# Patient Record
Sex: Female | Born: 1950
Health system: Southern US, Community
[De-identification: ages and names within clinical notes are randomized; demographics above are authoritative.]

## PROBLEM LIST (undated history)

## (undated) DIAGNOSIS — N289 Disorder of kidney and ureter, unspecified: Secondary | ICD-10-CM

## (undated) DIAGNOSIS — Z85528 Personal history of other malignant neoplasm of kidney: Secondary | ICD-10-CM

## (undated) DIAGNOSIS — G4730 Sleep apnea, unspecified: Secondary | ICD-10-CM

## (undated) DIAGNOSIS — F32A Depression, unspecified: Secondary | ICD-10-CM

## (undated) DIAGNOSIS — E559 Vitamin D deficiency, unspecified: Secondary | ICD-10-CM

## (undated) DIAGNOSIS — C801 Malignant (primary) neoplasm, unspecified: Secondary | ICD-10-CM

## (undated) DIAGNOSIS — E538 Deficiency of other specified B group vitamins: Secondary | ICD-10-CM

## (undated) DIAGNOSIS — I10 Essential (primary) hypertension: Secondary | ICD-10-CM

## (undated) DIAGNOSIS — M1990 Unspecified osteoarthritis, unspecified site: Secondary | ICD-10-CM

## (undated) HISTORY — DX: Unspecified osteoarthritis, unspecified site: M19.90

## (undated) HISTORY — DX: Sleep apnea, unspecified: G47.30

## (undated) HISTORY — DX: Depression, unspecified: F32.A

## (undated) HISTORY — DX: Vitamin D deficiency, unspecified: E55.9

## (undated) HISTORY — DX: Deficiency of other specified B group vitamins: E53.8

## (undated) HISTORY — PX: NEPHRECTOMY: SHX65

---

## 2004-09-04 ENCOUNTER — Emergency Department (HOSPITAL_COMMUNITY): Admission: EM | Admit: 2004-09-04 | Discharge: 2004-09-04 | Payer: Self-pay | Admitting: Emergency Medicine

## 2006-09-18 ENCOUNTER — Emergency Department (HOSPITAL_COMMUNITY): Admission: EM | Admit: 2006-09-18 | Discharge: 2006-09-18 | Payer: Self-pay | Admitting: Emergency Medicine

## 2010-12-24 ENCOUNTER — Ambulatory Visit
Admission: RE | Admit: 2010-12-24 | Discharge: 2010-12-24 | Disposition: A | Discharge status: Home / Self Care | Payer: Self-pay | Source: Ambulatory Visit | Attending: Orthopedic Surgery | Admitting: Orthopedic Surgery

## 2010-12-24 ENCOUNTER — Other Ambulatory Visit: Payer: Self-pay | Admitting: Orthopedic Surgery

## 2010-12-24 DIAGNOSIS — R52 Pain, unspecified: Secondary | ICD-10-CM

## 2012-02-07 DIAGNOSIS — G4733 Obstructive sleep apnea (adult) (pediatric): Secondary | ICD-10-CM | POA: Insufficient documentation

## 2012-09-18 DIAGNOSIS — C649 Malignant neoplasm of unspecified kidney, except renal pelvis: Secondary | ICD-10-CM | POA: Insufficient documentation

## 2012-09-18 DIAGNOSIS — F32A Depression, unspecified: Secondary | ICD-10-CM | POA: Insufficient documentation

## 2013-02-21 ENCOUNTER — Other Ambulatory Visit: Payer: Self-pay | Admitting: Orthopedic Surgery

## 2013-02-21 DIAGNOSIS — M549 Dorsalgia, unspecified: Secondary | ICD-10-CM

## 2013-03-03 ENCOUNTER — Ambulatory Visit
Admission: RE | Admit: 2013-03-03 | Discharge: 2013-03-03 | Disposition: A | Discharge status: Home / Self Care | Payer: BC Managed Care – PPO | Source: Ambulatory Visit | Attending: Orthopedic Surgery | Admitting: Orthopedic Surgery

## 2013-03-03 DIAGNOSIS — M549 Dorsalgia, unspecified: Secondary | ICD-10-CM

## 2014-11-29 ENCOUNTER — Other Ambulatory Visit: Payer: Self-pay | Admitting: Orthopedic Surgery

## 2014-11-29 DIAGNOSIS — M79662 Pain in left lower leg: Secondary | ICD-10-CM

## 2014-12-09 ENCOUNTER — Ambulatory Visit
Admission: RE | Admit: 2014-12-09 | Discharge: 2014-12-09 | Disposition: A | Discharge status: Home / Self Care | Payer: BLUE CROSS/BLUE SHIELD | Source: Ambulatory Visit | Attending: Orthopedic Surgery | Admitting: Orthopedic Surgery

## 2014-12-09 DIAGNOSIS — M79662 Pain in left lower leg: Secondary | ICD-10-CM

## 2016-04-29 DIAGNOSIS — R7989 Other specified abnormal findings of blood chemistry: Secondary | ICD-10-CM | POA: Insufficient documentation

## 2016-06-21 ENCOUNTER — Telehealth (INDEPENDENT_AMBULATORY_CARE_PROVIDER_SITE_OTHER): Payer: Self-pay | Admitting: Orthopedic Surgery

## 2016-06-21 NOTE — Telephone Encounter (Signed)
Patient's husband (douglas) called advised his wife need new Orthotics. He asked if Dr Marlou Sa can write a Rx for her to get them or does she need to make an appointment to see Dr Marlou Sa. The number to contact patient is [phone]

## 2016-06-22 NOTE — Telephone Encounter (Signed)
Patient last seen 12/2014.  Please advise.

## 2016-06-30 NOTE — Telephone Encounter (Signed)
Ok for orthotics pls call thx

## 2016-06-30 NOTE — Telephone Encounter (Signed)
Can you write Rx for orthotics with Dx?  You can stamp it, thanks

## 2016-06-30 NOTE — Telephone Encounter (Signed)
Called and left message advising could be picked up at front desk

## 2017-04-20 DIAGNOSIS — S20219A Contusion of unspecified front wall of thorax, initial encounter: Secondary | ICD-10-CM | POA: Diagnosis not present

## 2017-05-11 DIAGNOSIS — I951 Orthostatic hypotension: Secondary | ICD-10-CM | POA: Diagnosis not present

## 2017-05-11 DIAGNOSIS — Z6836 Body mass index (BMI) 36.0-36.9, adult: Secondary | ICD-10-CM | POA: Diagnosis not present

## 2017-05-11 DIAGNOSIS — R011 Cardiac murmur, unspecified: Secondary | ICD-10-CM | POA: Diagnosis not present

## 2017-05-11 DIAGNOSIS — R0789 Other chest pain: Secondary | ICD-10-CM | POA: Diagnosis not present

## 2017-05-11 DIAGNOSIS — R42 Dizziness and giddiness: Secondary | ICD-10-CM | POA: Diagnosis not present

## 2017-05-11 DIAGNOSIS — I10 Essential (primary) hypertension: Secondary | ICD-10-CM | POA: Diagnosis not present

## 2017-05-11 DIAGNOSIS — E669 Obesity, unspecified: Secondary | ICD-10-CM | POA: Diagnosis not present

## 2017-05-31 DIAGNOSIS — I10 Essential (primary) hypertension: Secondary | ICD-10-CM | POA: Diagnosis not present

## 2017-05-31 DIAGNOSIS — R0609 Other forms of dyspnea: Secondary | ICD-10-CM | POA: Diagnosis not present

## 2017-06-09 DIAGNOSIS — Z905 Acquired absence of kidney: Secondary | ICD-10-CM | POA: Diagnosis not present

## 2017-06-09 DIAGNOSIS — N1831 Chronic kidney disease, stage 3a: Secondary | ICD-10-CM | POA: Insufficient documentation

## 2017-06-09 DIAGNOSIS — I151 Hypertension secondary to other renal disorders: Secondary | ICD-10-CM | POA: Diagnosis not present

## 2017-06-09 DIAGNOSIS — I10 Essential (primary) hypertension: Secondary | ICD-10-CM | POA: Diagnosis not present

## 2017-06-09 DIAGNOSIS — N2889 Other specified disorders of kidney and ureter: Secondary | ICD-10-CM | POA: Diagnosis not present

## 2017-06-09 DIAGNOSIS — N183 Chronic kidney disease, stage 3 (moderate): Secondary | ICD-10-CM | POA: Diagnosis not present

## 2017-06-24 DIAGNOSIS — Z1231 Encounter for screening mammogram for malignant neoplasm of breast: Secondary | ICD-10-CM | POA: Diagnosis not present

## 2017-06-24 DIAGNOSIS — I517 Cardiomegaly: Secondary | ICD-10-CM | POA: Diagnosis not present

## 2017-06-24 DIAGNOSIS — I080 Rheumatic disorders of both mitral and aortic valves: Secondary | ICD-10-CM | POA: Diagnosis not present

## 2017-07-04 DIAGNOSIS — G4733 Obstructive sleep apnea (adult) (pediatric): Secondary | ICD-10-CM | POA: Diagnosis not present

## 2017-08-10 DIAGNOSIS — H2513 Age-related nuclear cataract, bilateral: Secondary | ICD-10-CM | POA: Diagnosis not present

## 2017-09-13 DIAGNOSIS — M25572 Pain in left ankle and joints of left foot: Secondary | ICD-10-CM | POA: Diagnosis not present

## 2017-09-13 DIAGNOSIS — M79675 Pain in left toe(s): Secondary | ICD-10-CM | POA: Diagnosis not present

## 2017-09-13 DIAGNOSIS — M79671 Pain in right foot: Secondary | ICD-10-CM | POA: Diagnosis not present

## 2017-09-13 DIAGNOSIS — M9906 Segmental and somatic dysfunction of lower extremity: Secondary | ICD-10-CM | POA: Diagnosis not present

## 2017-09-13 DIAGNOSIS — M79674 Pain in right toe(s): Secondary | ICD-10-CM | POA: Diagnosis not present

## 2017-09-13 DIAGNOSIS — M25562 Pain in left knee: Secondary | ICD-10-CM | POA: Diagnosis not present

## 2017-09-13 DIAGNOSIS — M79672 Pain in left foot: Secondary | ICD-10-CM | POA: Diagnosis not present

## 2017-09-13 DIAGNOSIS — M25571 Pain in right ankle and joints of right foot: Secondary | ICD-10-CM | POA: Diagnosis not present

## 2017-09-16 DIAGNOSIS — M9906 Segmental and somatic dysfunction of lower extremity: Secondary | ICD-10-CM | POA: Diagnosis not present

## 2017-09-16 DIAGNOSIS — M79671 Pain in right foot: Secondary | ICD-10-CM | POA: Diagnosis not present

## 2017-09-16 DIAGNOSIS — M79674 Pain in right toe(s): Secondary | ICD-10-CM | POA: Diagnosis not present

## 2017-09-16 DIAGNOSIS — M25572 Pain in left ankle and joints of left foot: Secondary | ICD-10-CM | POA: Diagnosis not present

## 2017-09-16 DIAGNOSIS — M25571 Pain in right ankle and joints of right foot: Secondary | ICD-10-CM | POA: Diagnosis not present

## 2017-09-16 DIAGNOSIS — M25562 Pain in left knee: Secondary | ICD-10-CM | POA: Diagnosis not present

## 2017-09-16 DIAGNOSIS — M79675 Pain in left toe(s): Secondary | ICD-10-CM | POA: Diagnosis not present

## 2017-09-16 DIAGNOSIS — M79672 Pain in left foot: Secondary | ICD-10-CM | POA: Diagnosis not present

## 2017-09-20 DIAGNOSIS — Z85528 Personal history of other malignant neoplasm of kidney: Secondary | ICD-10-CM | POA: Diagnosis not present

## 2017-09-20 DIAGNOSIS — R42 Dizziness and giddiness: Secondary | ICD-10-CM | POA: Diagnosis not present

## 2017-09-20 DIAGNOSIS — I10 Essential (primary) hypertension: Secondary | ICD-10-CM | POA: Diagnosis not present

## 2017-09-20 DIAGNOSIS — H8120 Vestibular neuronitis, unspecified ear: Secondary | ICD-10-CM | POA: Diagnosis not present

## 2017-09-20 DIAGNOSIS — R1110 Vomiting, unspecified: Secondary | ICD-10-CM | POA: Diagnosis not present

## 2017-09-20 DIAGNOSIS — Z79899 Other long term (current) drug therapy: Secondary | ICD-10-CM | POA: Diagnosis not present

## 2017-09-20 DIAGNOSIS — I639 Cerebral infarction, unspecified: Secondary | ICD-10-CM | POA: Diagnosis not present

## 2017-09-27 DIAGNOSIS — M25571 Pain in right ankle and joints of right foot: Secondary | ICD-10-CM | POA: Diagnosis not present

## 2017-09-27 DIAGNOSIS — M79674 Pain in right toe(s): Secondary | ICD-10-CM | POA: Diagnosis not present

## 2017-09-27 DIAGNOSIS — M79672 Pain in left foot: Secondary | ICD-10-CM | POA: Diagnosis not present

## 2017-09-27 DIAGNOSIS — M25572 Pain in left ankle and joints of left foot: Secondary | ICD-10-CM | POA: Diagnosis not present

## 2017-09-27 DIAGNOSIS — M79671 Pain in right foot: Secondary | ICD-10-CM | POA: Diagnosis not present

## 2017-09-27 DIAGNOSIS — M25562 Pain in left knee: Secondary | ICD-10-CM | POA: Diagnosis not present

## 2017-09-27 DIAGNOSIS — M9906 Segmental and somatic dysfunction of lower extremity: Secondary | ICD-10-CM | POA: Diagnosis not present

## 2017-09-27 DIAGNOSIS — M79675 Pain in left toe(s): Secondary | ICD-10-CM | POA: Diagnosis not present

## 2017-09-28 DIAGNOSIS — M79675 Pain in left toe(s): Secondary | ICD-10-CM | POA: Diagnosis not present

## 2017-09-28 DIAGNOSIS — M79674 Pain in right toe(s): Secondary | ICD-10-CM | POA: Diagnosis not present

## 2017-09-28 DIAGNOSIS — R42 Dizziness and giddiness: Secondary | ICD-10-CM | POA: Diagnosis not present

## 2017-09-28 DIAGNOSIS — M25571 Pain in right ankle and joints of right foot: Secondary | ICD-10-CM | POA: Diagnosis not present

## 2017-09-28 DIAGNOSIS — M9906 Segmental and somatic dysfunction of lower extremity: Secondary | ICD-10-CM | POA: Diagnosis not present

## 2017-09-28 DIAGNOSIS — M25562 Pain in left knee: Secondary | ICD-10-CM | POA: Diagnosis not present

## 2017-09-28 DIAGNOSIS — M79672 Pain in left foot: Secondary | ICD-10-CM | POA: Diagnosis not present

## 2017-09-28 DIAGNOSIS — M25572 Pain in left ankle and joints of left foot: Secondary | ICD-10-CM | POA: Diagnosis not present

## 2017-09-28 DIAGNOSIS — M79671 Pain in right foot: Secondary | ICD-10-CM | POA: Diagnosis not present

## 2017-09-28 DIAGNOSIS — M791 Myalgia, unspecified site: Secondary | ICD-10-CM | POA: Diagnosis not present

## 2017-09-29 DIAGNOSIS — M791 Myalgia, unspecified site: Secondary | ICD-10-CM | POA: Diagnosis not present

## 2017-09-29 DIAGNOSIS — M25562 Pain in left knee: Secondary | ICD-10-CM | POA: Diagnosis not present

## 2017-09-29 DIAGNOSIS — M79671 Pain in right foot: Secondary | ICD-10-CM | POA: Diagnosis not present

## 2017-09-29 DIAGNOSIS — M79675 Pain in left toe(s): Secondary | ICD-10-CM | POA: Diagnosis not present

## 2017-09-29 DIAGNOSIS — M79672 Pain in left foot: Secondary | ICD-10-CM | POA: Diagnosis not present

## 2017-09-29 DIAGNOSIS — M25571 Pain in right ankle and joints of right foot: Secondary | ICD-10-CM | POA: Diagnosis not present

## 2017-09-29 DIAGNOSIS — R42 Dizziness and giddiness: Secondary | ICD-10-CM | POA: Diagnosis not present

## 2017-09-29 DIAGNOSIS — M79674 Pain in right toe(s): Secondary | ICD-10-CM | POA: Diagnosis not present

## 2017-09-29 DIAGNOSIS — M25572 Pain in left ankle and joints of left foot: Secondary | ICD-10-CM | POA: Diagnosis not present

## 2017-09-29 DIAGNOSIS — M9906 Segmental and somatic dysfunction of lower extremity: Secondary | ICD-10-CM | POA: Diagnosis not present

## 2017-10-04 DIAGNOSIS — M79671 Pain in right foot: Secondary | ICD-10-CM | POA: Diagnosis not present

## 2017-10-04 DIAGNOSIS — M25562 Pain in left knee: Secondary | ICD-10-CM | POA: Diagnosis not present

## 2017-10-04 DIAGNOSIS — M79675 Pain in left toe(s): Secondary | ICD-10-CM | POA: Diagnosis not present

## 2017-10-04 DIAGNOSIS — M79672 Pain in left foot: Secondary | ICD-10-CM | POA: Diagnosis not present

## 2017-10-04 DIAGNOSIS — M9906 Segmental and somatic dysfunction of lower extremity: Secondary | ICD-10-CM | POA: Diagnosis not present

## 2017-10-04 DIAGNOSIS — M25571 Pain in right ankle and joints of right foot: Secondary | ICD-10-CM | POA: Diagnosis not present

## 2017-10-04 DIAGNOSIS — M791 Myalgia, unspecified site: Secondary | ICD-10-CM | POA: Diagnosis not present

## 2017-10-04 DIAGNOSIS — M25572 Pain in left ankle and joints of left foot: Secondary | ICD-10-CM | POA: Diagnosis not present

## 2017-10-04 DIAGNOSIS — R42 Dizziness and giddiness: Secondary | ICD-10-CM | POA: Diagnosis not present

## 2017-10-04 DIAGNOSIS — M79674 Pain in right toe(s): Secondary | ICD-10-CM | POA: Diagnosis not present

## 2017-10-05 DIAGNOSIS — H9311 Tinnitus, right ear: Secondary | ICD-10-CM | POA: Diagnosis not present

## 2017-10-05 DIAGNOSIS — H6121 Impacted cerumen, right ear: Secondary | ICD-10-CM | POA: Diagnosis not present

## 2017-10-05 DIAGNOSIS — H6061 Unspecified chronic otitis externa, right ear: Secondary | ICD-10-CM | POA: Diagnosis not present

## 2017-10-05 DIAGNOSIS — H903 Sensorineural hearing loss, bilateral: Secondary | ICD-10-CM | POA: Diagnosis not present

## 2017-10-05 DIAGNOSIS — J320 Chronic maxillary sinusitis: Secondary | ICD-10-CM | POA: Diagnosis not present

## 2017-10-05 DIAGNOSIS — H8143 Vertigo of central origin, bilateral: Secondary | ICD-10-CM | POA: Diagnosis not present

## 2017-10-06 DIAGNOSIS — M25571 Pain in right ankle and joints of right foot: Secondary | ICD-10-CM | POA: Diagnosis not present

## 2017-10-06 DIAGNOSIS — M25572 Pain in left ankle and joints of left foot: Secondary | ICD-10-CM | POA: Diagnosis not present

## 2017-10-06 DIAGNOSIS — M79672 Pain in left foot: Secondary | ICD-10-CM | POA: Diagnosis not present

## 2017-10-06 DIAGNOSIS — M79675 Pain in left toe(s): Secondary | ICD-10-CM | POA: Diagnosis not present

## 2017-10-06 DIAGNOSIS — M25562 Pain in left knee: Secondary | ICD-10-CM | POA: Diagnosis not present

## 2017-10-06 DIAGNOSIS — R42 Dizziness and giddiness: Secondary | ICD-10-CM | POA: Diagnosis not present

## 2017-10-06 DIAGNOSIS — M79674 Pain in right toe(s): Secondary | ICD-10-CM | POA: Diagnosis not present

## 2017-10-06 DIAGNOSIS — M791 Myalgia, unspecified site: Secondary | ICD-10-CM | POA: Diagnosis not present

## 2017-10-06 DIAGNOSIS — M79671 Pain in right foot: Secondary | ICD-10-CM | POA: Diagnosis not present

## 2017-10-06 DIAGNOSIS — M9906 Segmental and somatic dysfunction of lower extremity: Secondary | ICD-10-CM | POA: Diagnosis not present

## 2017-10-11 DIAGNOSIS — M79671 Pain in right foot: Secondary | ICD-10-CM | POA: Diagnosis not present

## 2017-10-11 DIAGNOSIS — M25562 Pain in left knee: Secondary | ICD-10-CM | POA: Diagnosis not present

## 2017-10-11 DIAGNOSIS — M79674 Pain in right toe(s): Secondary | ICD-10-CM | POA: Diagnosis not present

## 2017-10-11 DIAGNOSIS — M79675 Pain in left toe(s): Secondary | ICD-10-CM | POA: Diagnosis not present

## 2017-10-11 DIAGNOSIS — R42 Dizziness and giddiness: Secondary | ICD-10-CM | POA: Diagnosis not present

## 2017-10-11 DIAGNOSIS — M25571 Pain in right ankle and joints of right foot: Secondary | ICD-10-CM | POA: Diagnosis not present

## 2017-10-11 DIAGNOSIS — M791 Myalgia, unspecified site: Secondary | ICD-10-CM | POA: Diagnosis not present

## 2017-10-11 DIAGNOSIS — M9906 Segmental and somatic dysfunction of lower extremity: Secondary | ICD-10-CM | POA: Diagnosis not present

## 2017-10-11 DIAGNOSIS — M79672 Pain in left foot: Secondary | ICD-10-CM | POA: Diagnosis not present

## 2017-10-11 DIAGNOSIS — M25572 Pain in left ankle and joints of left foot: Secondary | ICD-10-CM | POA: Diagnosis not present

## 2017-10-17 DIAGNOSIS — R69 Illness, unspecified: Secondary | ICD-10-CM | POA: Diagnosis not present

## 2017-10-17 DIAGNOSIS — K05323 Chronic periodontitis, generalized, severe: Secondary | ICD-10-CM | POA: Diagnosis not present

## 2017-10-25 DIAGNOSIS — M25571 Pain in right ankle and joints of right foot: Secondary | ICD-10-CM | POA: Diagnosis not present

## 2017-10-25 DIAGNOSIS — M9906 Segmental and somatic dysfunction of lower extremity: Secondary | ICD-10-CM | POA: Diagnosis not present

## 2017-10-25 DIAGNOSIS — M791 Myalgia, unspecified site: Secondary | ICD-10-CM | POA: Diagnosis not present

## 2017-10-25 DIAGNOSIS — M79674 Pain in right toe(s): Secondary | ICD-10-CM | POA: Diagnosis not present

## 2017-10-25 DIAGNOSIS — R42 Dizziness and giddiness: Secondary | ICD-10-CM | POA: Diagnosis not present

## 2017-10-25 DIAGNOSIS — M79675 Pain in left toe(s): Secondary | ICD-10-CM | POA: Diagnosis not present

## 2017-10-25 DIAGNOSIS — M25572 Pain in left ankle and joints of left foot: Secondary | ICD-10-CM | POA: Diagnosis not present

## 2017-10-25 DIAGNOSIS — M25562 Pain in left knee: Secondary | ICD-10-CM | POA: Diagnosis not present

## 2017-10-25 DIAGNOSIS — M79672 Pain in left foot: Secondary | ICD-10-CM | POA: Diagnosis not present

## 2017-10-25 DIAGNOSIS — M79671 Pain in right foot: Secondary | ICD-10-CM | POA: Diagnosis not present

## 2017-10-26 DIAGNOSIS — H8143 Vertigo of central origin, bilateral: Secondary | ICD-10-CM | POA: Diagnosis not present

## 2017-10-26 DIAGNOSIS — H8110 Benign paroxysmal vertigo, unspecified ear: Secondary | ICD-10-CM | POA: Diagnosis not present

## 2017-10-27 DIAGNOSIS — M9906 Segmental and somatic dysfunction of lower extremity: Secondary | ICD-10-CM | POA: Diagnosis not present

## 2017-10-27 DIAGNOSIS — M25562 Pain in left knee: Secondary | ICD-10-CM | POA: Diagnosis not present

## 2017-10-27 DIAGNOSIS — M79672 Pain in left foot: Secondary | ICD-10-CM | POA: Diagnosis not present

## 2017-10-27 DIAGNOSIS — M791 Myalgia, unspecified site: Secondary | ICD-10-CM | POA: Diagnosis not present

## 2017-10-27 DIAGNOSIS — R42 Dizziness and giddiness: Secondary | ICD-10-CM | POA: Diagnosis not present

## 2017-10-27 DIAGNOSIS — M79671 Pain in right foot: Secondary | ICD-10-CM | POA: Diagnosis not present

## 2017-10-27 DIAGNOSIS — M25572 Pain in left ankle and joints of left foot: Secondary | ICD-10-CM | POA: Diagnosis not present

## 2017-10-27 DIAGNOSIS — M79674 Pain in right toe(s): Secondary | ICD-10-CM | POA: Diagnosis not present

## 2017-10-27 DIAGNOSIS — M25571 Pain in right ankle and joints of right foot: Secondary | ICD-10-CM | POA: Diagnosis not present

## 2017-10-27 DIAGNOSIS — M79675 Pain in left toe(s): Secondary | ICD-10-CM | POA: Diagnosis not present

## 2017-11-02 DIAGNOSIS — M79674 Pain in right toe(s): Secondary | ICD-10-CM | POA: Diagnosis not present

## 2017-11-02 DIAGNOSIS — M79671 Pain in right foot: Secondary | ICD-10-CM | POA: Diagnosis not present

## 2017-11-02 DIAGNOSIS — M25571 Pain in right ankle and joints of right foot: Secondary | ICD-10-CM | POA: Diagnosis not present

## 2017-11-02 DIAGNOSIS — M79675 Pain in left toe(s): Secondary | ICD-10-CM | POA: Diagnosis not present

## 2017-11-02 DIAGNOSIS — R42 Dizziness and giddiness: Secondary | ICD-10-CM | POA: Diagnosis not present

## 2017-11-02 DIAGNOSIS — M25562 Pain in left knee: Secondary | ICD-10-CM | POA: Diagnosis not present

## 2017-11-02 DIAGNOSIS — M79672 Pain in left foot: Secondary | ICD-10-CM | POA: Diagnosis not present

## 2017-11-02 DIAGNOSIS — M791 Myalgia, unspecified site: Secondary | ICD-10-CM | POA: Diagnosis not present

## 2017-11-02 DIAGNOSIS — M9906 Segmental and somatic dysfunction of lower extremity: Secondary | ICD-10-CM | POA: Diagnosis not present

## 2017-11-02 DIAGNOSIS — M25572 Pain in left ankle and joints of left foot: Secondary | ICD-10-CM | POA: Diagnosis not present

## 2017-11-04 DIAGNOSIS — M79674 Pain in right toe(s): Secondary | ICD-10-CM | POA: Diagnosis not present

## 2017-11-04 DIAGNOSIS — M79671 Pain in right foot: Secondary | ICD-10-CM | POA: Diagnosis not present

## 2017-11-04 DIAGNOSIS — M9906 Segmental and somatic dysfunction of lower extremity: Secondary | ICD-10-CM | POA: Diagnosis not present

## 2017-11-04 DIAGNOSIS — M25572 Pain in left ankle and joints of left foot: Secondary | ICD-10-CM | POA: Diagnosis not present

## 2017-11-04 DIAGNOSIS — M25562 Pain in left knee: Secondary | ICD-10-CM | POA: Diagnosis not present

## 2017-11-04 DIAGNOSIS — R42 Dizziness and giddiness: Secondary | ICD-10-CM | POA: Diagnosis not present

## 2017-11-04 DIAGNOSIS — M791 Myalgia, unspecified site: Secondary | ICD-10-CM | POA: Diagnosis not present

## 2017-11-04 DIAGNOSIS — M79672 Pain in left foot: Secondary | ICD-10-CM | POA: Diagnosis not present

## 2017-11-04 DIAGNOSIS — M25571 Pain in right ankle and joints of right foot: Secondary | ICD-10-CM | POA: Diagnosis not present

## 2017-11-04 DIAGNOSIS — M79675 Pain in left toe(s): Secondary | ICD-10-CM | POA: Diagnosis not present

## 2017-11-09 DIAGNOSIS — M79675 Pain in left toe(s): Secondary | ICD-10-CM | POA: Diagnosis not present

## 2017-11-09 DIAGNOSIS — R42 Dizziness and giddiness: Secondary | ICD-10-CM | POA: Diagnosis not present

## 2017-11-09 DIAGNOSIS — M25572 Pain in left ankle and joints of left foot: Secondary | ICD-10-CM | POA: Diagnosis not present

## 2017-11-09 DIAGNOSIS — M25562 Pain in left knee: Secondary | ICD-10-CM | POA: Diagnosis not present

## 2017-11-09 DIAGNOSIS — M791 Myalgia, unspecified site: Secondary | ICD-10-CM | POA: Diagnosis not present

## 2017-11-09 DIAGNOSIS — M9906 Segmental and somatic dysfunction of lower extremity: Secondary | ICD-10-CM | POA: Diagnosis not present

## 2017-11-09 DIAGNOSIS — M25571 Pain in right ankle and joints of right foot: Secondary | ICD-10-CM | POA: Diagnosis not present

## 2017-11-09 DIAGNOSIS — M79674 Pain in right toe(s): Secondary | ICD-10-CM | POA: Diagnosis not present

## 2017-11-09 DIAGNOSIS — M79672 Pain in left foot: Secondary | ICD-10-CM | POA: Diagnosis not present

## 2017-11-09 DIAGNOSIS — M79671 Pain in right foot: Secondary | ICD-10-CM | POA: Diagnosis not present

## 2017-11-10 DIAGNOSIS — I10 Essential (primary) hypertension: Secondary | ICD-10-CM | POA: Diagnosis not present

## 2017-11-10 DIAGNOSIS — E039 Hypothyroidism, unspecified: Secondary | ICD-10-CM | POA: Diagnosis not present

## 2017-11-10 DIAGNOSIS — J0101 Acute recurrent maxillary sinusitis: Secondary | ICD-10-CM | POA: Diagnosis not present

## 2017-11-11 DIAGNOSIS — H6521 Chronic serous otitis media, right ear: Secondary | ICD-10-CM | POA: Diagnosis not present

## 2017-11-14 DIAGNOSIS — R42 Dizziness and giddiness: Secondary | ICD-10-CM | POA: Diagnosis not present

## 2017-11-14 DIAGNOSIS — Z905 Acquired absence of kidney: Secondary | ICD-10-CM | POA: Insufficient documentation

## 2017-11-14 DIAGNOSIS — N2889 Other specified disorders of kidney and ureter: Secondary | ICD-10-CM | POA: Diagnosis not present

## 2017-11-14 DIAGNOSIS — M79674 Pain in right toe(s): Secondary | ICD-10-CM | POA: Diagnosis not present

## 2017-11-14 DIAGNOSIS — M9906 Segmental and somatic dysfunction of lower extremity: Secondary | ICD-10-CM | POA: Diagnosis not present

## 2017-11-14 DIAGNOSIS — M79672 Pain in left foot: Secondary | ICD-10-CM | POA: Diagnosis not present

## 2017-11-14 DIAGNOSIS — I10 Essential (primary) hypertension: Secondary | ICD-10-CM | POA: Diagnosis not present

## 2017-11-14 DIAGNOSIS — M79675 Pain in left toe(s): Secondary | ICD-10-CM | POA: Diagnosis not present

## 2017-11-14 DIAGNOSIS — I151 Hypertension secondary to other renal disorders: Secondary | ICD-10-CM | POA: Diagnosis not present

## 2017-11-14 DIAGNOSIS — M25571 Pain in right ankle and joints of right foot: Secondary | ICD-10-CM | POA: Diagnosis not present

## 2017-11-14 DIAGNOSIS — M25572 Pain in left ankle and joints of left foot: Secondary | ICD-10-CM | POA: Diagnosis not present

## 2017-11-14 DIAGNOSIS — M79671 Pain in right foot: Secondary | ICD-10-CM | POA: Diagnosis not present

## 2017-11-14 DIAGNOSIS — M791 Myalgia, unspecified site: Secondary | ICD-10-CM | POA: Diagnosis not present

## 2017-11-14 DIAGNOSIS — N183 Chronic kidney disease, stage 3 (moderate): Secondary | ICD-10-CM | POA: Diagnosis not present

## 2017-11-14 DIAGNOSIS — M25562 Pain in left knee: Secondary | ICD-10-CM | POA: Diagnosis not present

## 2017-11-14 DIAGNOSIS — C642 Malignant neoplasm of left kidney, except renal pelvis: Secondary | ICD-10-CM | POA: Diagnosis not present

## 2017-11-24 DIAGNOSIS — G4733 Obstructive sleep apnea (adult) (pediatric): Secondary | ICD-10-CM | POA: Diagnosis not present

## 2017-11-29 DIAGNOSIS — M79675 Pain in left toe(s): Secondary | ICD-10-CM | POA: Diagnosis not present

## 2017-11-29 DIAGNOSIS — M9906 Segmental and somatic dysfunction of lower extremity: Secondary | ICD-10-CM | POA: Diagnosis not present

## 2017-11-29 DIAGNOSIS — M79672 Pain in left foot: Secondary | ICD-10-CM | POA: Diagnosis not present

## 2017-11-29 DIAGNOSIS — R42 Dizziness and giddiness: Secondary | ICD-10-CM | POA: Diagnosis not present

## 2017-11-29 DIAGNOSIS — M25572 Pain in left ankle and joints of left foot: Secondary | ICD-10-CM | POA: Diagnosis not present

## 2017-11-29 DIAGNOSIS — M25571 Pain in right ankle and joints of right foot: Secondary | ICD-10-CM | POA: Diagnosis not present

## 2017-11-29 DIAGNOSIS — M79674 Pain in right toe(s): Secondary | ICD-10-CM | POA: Diagnosis not present

## 2017-11-29 DIAGNOSIS — M791 Myalgia, unspecified site: Secondary | ICD-10-CM | POA: Diagnosis not present

## 2017-11-29 DIAGNOSIS — M79671 Pain in right foot: Secondary | ICD-10-CM | POA: Diagnosis not present

## 2017-11-29 DIAGNOSIS — M25562 Pain in left knee: Secondary | ICD-10-CM | POA: Diagnosis not present

## 2017-12-01 DIAGNOSIS — M9902 Segmental and somatic dysfunction of thoracic region: Secondary | ICD-10-CM | POA: Diagnosis not present

## 2017-12-01 DIAGNOSIS — M47814 Spondylosis without myelopathy or radiculopathy, thoracic region: Secondary | ICD-10-CM | POA: Diagnosis not present

## 2017-12-01 DIAGNOSIS — M5032 Other cervical disc degeneration, mid-cervical region, unspecified level: Secondary | ICD-10-CM | POA: Diagnosis not present

## 2017-12-01 DIAGNOSIS — M9901 Segmental and somatic dysfunction of cervical region: Secondary | ICD-10-CM | POA: Diagnosis not present

## 2017-12-06 DIAGNOSIS — M9902 Segmental and somatic dysfunction of thoracic region: Secondary | ICD-10-CM | POA: Diagnosis not present

## 2017-12-06 DIAGNOSIS — M5032 Other cervical disc degeneration, mid-cervical region, unspecified level: Secondary | ICD-10-CM | POA: Diagnosis not present

## 2017-12-06 DIAGNOSIS — M47814 Spondylosis without myelopathy or radiculopathy, thoracic region: Secondary | ICD-10-CM | POA: Diagnosis not present

## 2017-12-06 DIAGNOSIS — M9901 Segmental and somatic dysfunction of cervical region: Secondary | ICD-10-CM | POA: Diagnosis not present

## 2017-12-07 DIAGNOSIS — R42 Dizziness and giddiness: Secondary | ICD-10-CM | POA: Diagnosis not present

## 2017-12-14 DIAGNOSIS — M9901 Segmental and somatic dysfunction of cervical region: Secondary | ICD-10-CM | POA: Diagnosis not present

## 2017-12-14 DIAGNOSIS — M47814 Spondylosis without myelopathy or radiculopathy, thoracic region: Secondary | ICD-10-CM | POA: Diagnosis not present

## 2017-12-14 DIAGNOSIS — M5032 Other cervical disc degeneration, mid-cervical region, unspecified level: Secondary | ICD-10-CM | POA: Diagnosis not present

## 2017-12-14 DIAGNOSIS — M9902 Segmental and somatic dysfunction of thoracic region: Secondary | ICD-10-CM | POA: Diagnosis not present

## 2017-12-26 DIAGNOSIS — G4733 Obstructive sleep apnea (adult) (pediatric): Secondary | ICD-10-CM | POA: Diagnosis not present

## 2017-12-28 DIAGNOSIS — M9902 Segmental and somatic dysfunction of thoracic region: Secondary | ICD-10-CM | POA: Diagnosis not present

## 2017-12-28 DIAGNOSIS — M9901 Segmental and somatic dysfunction of cervical region: Secondary | ICD-10-CM | POA: Diagnosis not present

## 2017-12-28 DIAGNOSIS — M47814 Spondylosis without myelopathy or radiculopathy, thoracic region: Secondary | ICD-10-CM | POA: Diagnosis not present

## 2017-12-28 DIAGNOSIS — M5032 Other cervical disc degeneration, mid-cervical region, unspecified level: Secondary | ICD-10-CM | POA: Diagnosis not present

## 2017-12-29 DIAGNOSIS — M47814 Spondylosis without myelopathy or radiculopathy, thoracic region: Secondary | ICD-10-CM | POA: Diagnosis not present

## 2017-12-29 DIAGNOSIS — M5032 Other cervical disc degeneration, mid-cervical region, unspecified level: Secondary | ICD-10-CM | POA: Diagnosis not present

## 2017-12-29 DIAGNOSIS — M9902 Segmental and somatic dysfunction of thoracic region: Secondary | ICD-10-CM | POA: Diagnosis not present

## 2017-12-29 DIAGNOSIS — M9901 Segmental and somatic dysfunction of cervical region: Secondary | ICD-10-CM | POA: Diagnosis not present

## 2018-01-26 DIAGNOSIS — G4733 Obstructive sleep apnea (adult) (pediatric): Secondary | ICD-10-CM | POA: Diagnosis not present

## 2018-03-21 DIAGNOSIS — H353121 Nonexudative age-related macular degeneration, left eye, early dry stage: Secondary | ICD-10-CM | POA: Diagnosis not present

## 2018-03-21 DIAGNOSIS — H353114 Nonexudative age-related macular degeneration, right eye, advanced atrophic with subfoveal involvement: Secondary | ICD-10-CM | POA: Diagnosis not present

## 2018-03-21 DIAGNOSIS — H43811 Vitreous degeneration, right eye: Secondary | ICD-10-CM | POA: Diagnosis not present

## 2018-03-21 DIAGNOSIS — H43822 Vitreomacular adhesion, left eye: Secondary | ICD-10-CM | POA: Diagnosis not present

## 2018-04-10 DIAGNOSIS — I10 Essential (primary) hypertension: Secondary | ICD-10-CM | POA: Diagnosis not present

## 2018-04-10 DIAGNOSIS — Z0000 Encounter for general adult medical examination without abnormal findings: Secondary | ICD-10-CM | POA: Diagnosis not present

## 2018-04-10 DIAGNOSIS — R5383 Other fatigue: Secondary | ICD-10-CM | POA: Diagnosis not present

## 2018-04-10 DIAGNOSIS — E039 Hypothyroidism, unspecified: Secondary | ICD-10-CM | POA: Diagnosis not present

## 2018-04-10 DIAGNOSIS — R69 Illness, unspecified: Secondary | ICD-10-CM | POA: Diagnosis not present

## 2018-04-10 DIAGNOSIS — Z780 Asymptomatic menopausal state: Secondary | ICD-10-CM | POA: Diagnosis not present

## 2018-04-10 DIAGNOSIS — F329 Major depressive disorder, single episode, unspecified: Secondary | ICD-10-CM | POA: Diagnosis not present

## 2018-04-10 DIAGNOSIS — R011 Cardiac murmur, unspecified: Secondary | ICD-10-CM | POA: Diagnosis not present

## 2018-06-03 DIAGNOSIS — G4733 Obstructive sleep apnea (adult) (pediatric): Secondary | ICD-10-CM | POA: Diagnosis not present

## 2018-06-21 DIAGNOSIS — N183 Chronic kidney disease, stage 3 (moderate): Secondary | ICD-10-CM | POA: Diagnosis not present

## 2018-06-21 DIAGNOSIS — I10 Essential (primary) hypertension: Secondary | ICD-10-CM | POA: Diagnosis not present

## 2018-06-21 DIAGNOSIS — Z905 Acquired absence of kidney: Secondary | ICD-10-CM | POA: Diagnosis not present

## 2018-06-21 DIAGNOSIS — C642 Malignant neoplasm of left kidney, except renal pelvis: Secondary | ICD-10-CM | POA: Diagnosis not present

## 2018-07-04 DIAGNOSIS — G4733 Obstructive sleep apnea (adult) (pediatric): Secondary | ICD-10-CM | POA: Diagnosis not present

## 2018-08-04 DIAGNOSIS — G4733 Obstructive sleep apnea (adult) (pediatric): Secondary | ICD-10-CM | POA: Diagnosis not present

## 2018-08-11 DIAGNOSIS — Z85528 Personal history of other malignant neoplasm of kidney: Secondary | ICD-10-CM | POA: Diagnosis not present

## 2018-08-11 DIAGNOSIS — N183 Chronic kidney disease, stage 3 (moderate): Secondary | ICD-10-CM | POA: Diagnosis not present

## 2018-08-11 DIAGNOSIS — E039 Hypothyroidism, unspecified: Secondary | ICD-10-CM | POA: Diagnosis not present

## 2018-09-04 DIAGNOSIS — G4733 Obstructive sleep apnea (adult) (pediatric): Secondary | ICD-10-CM | POA: Diagnosis not present

## 2018-10-05 DIAGNOSIS — G4733 Obstructive sleep apnea (adult) (pediatric): Secondary | ICD-10-CM | POA: Diagnosis not present

## 2018-11-06 DIAGNOSIS — G4733 Obstructive sleep apnea (adult) (pediatric): Secondary | ICD-10-CM | POA: Diagnosis not present

## 2018-12-07 DIAGNOSIS — G4733 Obstructive sleep apnea (adult) (pediatric): Secondary | ICD-10-CM | POA: Diagnosis not present

## 2018-12-25 DIAGNOSIS — I151 Hypertension secondary to other renal disorders: Secondary | ICD-10-CM | POA: Diagnosis not present

## 2018-12-25 DIAGNOSIS — Z905 Acquired absence of kidney: Secondary | ICD-10-CM | POA: Diagnosis not present

## 2018-12-25 DIAGNOSIS — C642 Malignant neoplasm of left kidney, except renal pelvis: Secondary | ICD-10-CM | POA: Diagnosis not present

## 2018-12-25 DIAGNOSIS — N183 Chronic kidney disease, stage 3 (moderate): Secondary | ICD-10-CM | POA: Diagnosis not present

## 2018-12-25 DIAGNOSIS — R739 Hyperglycemia, unspecified: Secondary | ICD-10-CM | POA: Diagnosis not present

## 2018-12-25 DIAGNOSIS — I10 Essential (primary) hypertension: Secondary | ICD-10-CM | POA: Diagnosis not present

## 2019-01-12 DIAGNOSIS — G4733 Obstructive sleep apnea (adult) (pediatric): Secondary | ICD-10-CM | POA: Diagnosis not present

## 2019-01-22 DIAGNOSIS — R69 Illness, unspecified: Secondary | ICD-10-CM | POA: Diagnosis not present

## 2019-02-13 DIAGNOSIS — G4733 Obstructive sleep apnea (adult) (pediatric): Secondary | ICD-10-CM | POA: Diagnosis not present

## 2019-03-03 DIAGNOSIS — Z20828 Contact with and (suspected) exposure to other viral communicable diseases: Secondary | ICD-10-CM | POA: Diagnosis not present

## 2019-03-16 DIAGNOSIS — G4733 Obstructive sleep apnea (adult) (pediatric): Secondary | ICD-10-CM | POA: Diagnosis not present

## 2019-03-27 DIAGNOSIS — H43811 Vitreous degeneration, right eye: Secondary | ICD-10-CM | POA: Diagnosis not present

## 2019-03-27 DIAGNOSIS — H353121 Nonexudative age-related macular degeneration, left eye, early dry stage: Secondary | ICD-10-CM | POA: Diagnosis not present

## 2019-03-27 DIAGNOSIS — H353114 Nonexudative age-related macular degeneration, right eye, advanced atrophic with subfoveal involvement: Secondary | ICD-10-CM | POA: Diagnosis not present

## 2019-03-27 DIAGNOSIS — H2513 Age-related nuclear cataract, bilateral: Secondary | ICD-10-CM | POA: Diagnosis not present

## 2019-03-29 DIAGNOSIS — Z0000 Encounter for general adult medical examination without abnormal findings: Secondary | ICD-10-CM | POA: Diagnosis not present

## 2019-03-29 DIAGNOSIS — H269 Unspecified cataract: Secondary | ICD-10-CM | POA: Diagnosis not present

## 2019-03-29 DIAGNOSIS — H3530 Unspecified macular degeneration: Secondary | ICD-10-CM | POA: Diagnosis not present

## 2019-03-29 DIAGNOSIS — G4700 Insomnia, unspecified: Secondary | ICD-10-CM | POA: Diagnosis not present

## 2019-03-29 DIAGNOSIS — I10 Essential (primary) hypertension: Secondary | ICD-10-CM | POA: Diagnosis not present

## 2019-03-29 DIAGNOSIS — E039 Hypothyroidism, unspecified: Secondary | ICD-10-CM | POA: Diagnosis not present

## 2019-04-16 DIAGNOSIS — G4733 Obstructive sleep apnea (adult) (pediatric): Secondary | ICD-10-CM | POA: Diagnosis not present

## 2019-04-17 DIAGNOSIS — H25043 Posterior subcapsular polar age-related cataract, bilateral: Secondary | ICD-10-CM | POA: Diagnosis not present

## 2019-04-17 DIAGNOSIS — H2513 Age-related nuclear cataract, bilateral: Secondary | ICD-10-CM | POA: Diagnosis not present

## 2019-04-17 DIAGNOSIS — H18413 Arcus senilis, bilateral: Secondary | ICD-10-CM | POA: Diagnosis not present

## 2019-04-17 DIAGNOSIS — H353134 Nonexudative age-related macular degeneration, bilateral, advanced atrophic with subfoveal involvement: Secondary | ICD-10-CM | POA: Diagnosis not present

## 2019-04-17 DIAGNOSIS — H2511 Age-related nuclear cataract, right eye: Secondary | ICD-10-CM | POA: Diagnosis not present

## 2019-04-21 DIAGNOSIS — H6123 Impacted cerumen, bilateral: Secondary | ICD-10-CM | POA: Diagnosis not present

## 2019-04-21 DIAGNOSIS — H6503 Acute serous otitis media, bilateral: Secondary | ICD-10-CM | POA: Diagnosis not present

## 2019-05-08 ENCOUNTER — Ambulatory Visit: Payer: BLUE CROSS/BLUE SHIELD

## 2019-05-25 ENCOUNTER — Ambulatory Visit: Payer: BLUE CROSS/BLUE SHIELD

## 2019-06-25 DIAGNOSIS — G8929 Other chronic pain: Secondary | ICD-10-CM | POA: Insufficient documentation

## 2019-06-25 DIAGNOSIS — F5101 Primary insomnia: Secondary | ICD-10-CM | POA: Insufficient documentation

## 2019-06-26 DIAGNOSIS — E559 Vitamin D deficiency, unspecified: Secondary | ICD-10-CM | POA: Insufficient documentation

## 2019-06-26 DIAGNOSIS — M47812 Spondylosis without myelopathy or radiculopathy, cervical region: Secondary | ICD-10-CM | POA: Insufficient documentation

## 2019-06-26 DIAGNOSIS — M47814 Spondylosis without myelopathy or radiculopathy, thoracic region: Secondary | ICD-10-CM | POA: Insufficient documentation

## 2019-07-03 ENCOUNTER — Other Ambulatory Visit: Payer: Self-pay | Admitting: Nephrology

## 2019-07-03 DIAGNOSIS — N1831 Chronic kidney disease, stage 3a: Secondary | ICD-10-CM

## 2019-07-03 DIAGNOSIS — Z905 Acquired absence of kidney: Secondary | ICD-10-CM

## 2019-07-13 ENCOUNTER — Ambulatory Visit
Admission: RE | Admit: 2019-07-13 | Discharge: 2019-07-13 | Disposition: A | Discharge status: Home / Self Care | Payer: Medicare HMO | Source: Ambulatory Visit | Attending: Nephrology | Admitting: Nephrology

## 2019-07-13 DIAGNOSIS — Z905 Acquired absence of kidney: Secondary | ICD-10-CM

## 2019-07-13 DIAGNOSIS — N1831 Chronic kidney disease, stage 3a: Secondary | ICD-10-CM

## 2021-07-08 ENCOUNTER — Telehealth (HOSPITAL_BASED_OUTPATIENT_CLINIC_OR_DEPARTMENT_OTHER): Payer: Self-pay | Admitting: Emergency Medicine

## 2021-07-08 ENCOUNTER — Emergency Department (HOSPITAL_BASED_OUTPATIENT_CLINIC_OR_DEPARTMENT_OTHER): Payer: Medicare HMO

## 2021-07-08 ENCOUNTER — Encounter (HOSPITAL_BASED_OUTPATIENT_CLINIC_OR_DEPARTMENT_OTHER): Payer: Self-pay

## 2021-07-08 ENCOUNTER — Other Ambulatory Visit: Payer: Self-pay

## 2021-07-08 ENCOUNTER — Emergency Department (HOSPITAL_BASED_OUTPATIENT_CLINIC_OR_DEPARTMENT_OTHER)
Admission: EM | Admit: 2021-07-08 | Discharge: 2021-07-08 | Disposition: A | Discharge status: Home / Self Care | Payer: Medicare HMO | Attending: Emergency Medicine | Admitting: Emergency Medicine

## 2021-07-08 DIAGNOSIS — Y92410 Unspecified street and highway as the place of occurrence of the external cause: Secondary | ICD-10-CM | POA: Diagnosis not present

## 2021-07-08 DIAGNOSIS — S52121A Displaced fracture of head of right radius, initial encounter for closed fracture: Secondary | ICD-10-CM | POA: Diagnosis not present

## 2021-07-08 DIAGNOSIS — S3992XA Unspecified injury of lower back, initial encounter: Secondary | ICD-10-CM | POA: Diagnosis not present

## 2021-07-08 DIAGNOSIS — S0990XA Unspecified injury of head, initial encounter: Secondary | ICD-10-CM | POA: Insufficient documentation

## 2021-07-08 DIAGNOSIS — S59901A Unspecified injury of right elbow, initial encounter: Secondary | ICD-10-CM | POA: Diagnosis present

## 2021-07-08 DIAGNOSIS — Z85528 Personal history of other malignant neoplasm of kidney: Secondary | ICD-10-CM

## 2021-07-08 HISTORY — DX: Essential (primary) hypertension: I10

## 2021-07-08 HISTORY — DX: Malignant (primary) neoplasm, unspecified: C80.1

## 2021-07-08 HISTORY — DX: Disorder of kidney and ureter, unspecified: N28.9

## 2021-07-08 HISTORY — DX: Personal history of other malignant neoplasm of kidney: Z85.528

## 2021-07-08 MED ORDER — OXYCODONE-ACETAMINOPHEN 5-325 MG PO TABS
1.0000 | ORAL_TABLET | Freq: Once | ORAL | Status: AC
  Administered 2021-07-08: 1 via ORAL
  Filled 2021-07-08: qty 1

## 2021-07-08 MED ORDER — OXYCODONE-ACETAMINOPHEN 5-325 MG PO TABS: 1.0000 | ORAL_TABLET | Freq: Four times a day (QID) | ORAL | 0 refills | Status: DC | PRN
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
## 2021-07-08 NOTE — ED Notes (Signed)
RN provided AVS using Teachback Method. Patient verbalizes understanding of Discharge Instructions. Opportunity for Questioning and Answers were provided by RN. Patient will have Splint and Shoulder Sling placed and assessed prior to Discharge. ? ?

## 2021-07-08 NOTE — Discharge Instructions (Addendum)
It was a pleasure taking care of you today!  ? ?Your x-ray showed fracture of your right radial head. Attached is information for the on-call Orthopedist, Dr. Lorin Mercy, call their office tomorrow to set up a follow up appointment on 07/10/21 with Dr. Lorin Mercy. It is important that you call the orthopedist and inform them that you were seen in the ED to set up a follow-up appointment. You will be placed in a splint and sling today.  You are prescribed Percocet, take as prescribed.  Do not operate any heavy machinery or drive while taking this medication.  You may apply ice to the affected area for up to 15 minutes at a time.  Ensure to place a barrier between your skin and the ice.  Return to the Emergency Department if you are experiencing increasing/worsening swelling, bruising, pain, or worsening symptoms. ?
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
## 2021-07-09 ENCOUNTER — Telehealth: Payer: Self-pay | Admitting: Orthopaedic Surgery
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
## 2021-07-09 NOTE — Telephone Encounter (Signed)
Patient appt made for tomorrow at 1pm. ?

## 2021-07-10 ENCOUNTER — Ambulatory Visit: Payer: Medicare HMO | Admitting: Orthopaedic Surgery

## 2021-07-10 ENCOUNTER — Encounter: Payer: Self-pay | Admitting: Orthopaedic Surgery

## 2021-07-10 DIAGNOSIS — S52121A Displaced fracture of head of right radius, initial encounter for closed fracture: Secondary | ICD-10-CM

## 2021-07-10 DIAGNOSIS — S300XXA Contusion of lower back and pelvis, initial encounter: Secondary | ICD-10-CM

## 2021-07-10 MED ORDER — OXYCODONE-ACETAMINOPHEN 5-325 MG PO TABS
1.0000 | ORAL_TABLET | ORAL | 0 refills | Status: DC | PRN
Start: 2021-07-10 — End: 2023-06-06
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
## 2021-07-12 DIAGNOSIS — S52123A Displaced fracture of head of unspecified radius, initial encounter for closed fracture: Secondary | ICD-10-CM | POA: Insufficient documentation

## 2021-07-12 DIAGNOSIS — S300XXA Contusion of lower back and pelvis, initial encounter: Secondary | ICD-10-CM | POA: Insufficient documentation

## 2021-07-24 ENCOUNTER — Ambulatory Visit: Payer: Self-pay

## 2021-07-24 ENCOUNTER — Encounter: Payer: Self-pay | Admitting: Orthopaedic Surgery

## 2021-07-24 ENCOUNTER — Ambulatory Visit: Payer: Medicare HMO | Admitting: Orthopaedic Surgery

## 2021-07-24 VITALS — BP 126/75 | Ht 68.0 in | Wt 247.0 lb

## 2021-07-24 DIAGNOSIS — S52121A Displaced fracture of head of right radius, initial encounter for closed fracture: Secondary | ICD-10-CM
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
## 2021-08-05 ENCOUNTER — Ambulatory Visit: Payer: Medicare HMO | Admitting: Orthopaedic Surgery

## 2021-08-05 ENCOUNTER — Encounter: Payer: Self-pay | Admitting: Orthopaedic Surgery

## 2021-08-05 ENCOUNTER — Ambulatory Visit (INDEPENDENT_AMBULATORY_CARE_PROVIDER_SITE_OTHER): Payer: Medicare HMO

## 2021-08-05 VITALS — BP 131/64 | HR 69 | Ht 68.0 in | Wt 247.0 lb

## 2021-08-05 DIAGNOSIS — M25531 Pain in right wrist: Secondary | ICD-10-CM | POA: Diagnosis not present

## 2021-08-05 DIAGNOSIS — S52121A Displaced fracture of head of right radius, initial encounter for closed fracture: Secondary | ICD-10-CM

## 2021-08-06 ENCOUNTER — Other Ambulatory Visit: Payer: Self-pay | Admitting: Orthopaedic Surgery

## 2021-08-06 DIAGNOSIS — S52121A Displaced fracture of head of right radius, initial encounter for closed fracture: Secondary | ICD-10-CM
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
## 2021-08-10 ENCOUNTER — Ambulatory Visit: Payer: Medicare HMO | Attending: Orthopaedic Surgery

## 2021-08-10 DIAGNOSIS — M25631 Stiffness of right wrist, not elsewhere classified: Secondary | ICD-10-CM | POA: Diagnosis not present

## 2021-08-10 DIAGNOSIS — Y838 Other surgical procedures as the cause of abnormal reaction of the patient, or of later complication, without mention of misadventure at the time of the procedure: Secondary | ICD-10-CM | POA: Diagnosis not present

## 2021-08-10 DIAGNOSIS — M25621 Stiffness of right elbow, not elsewhere classified: Secondary | ICD-10-CM | POA: Diagnosis not present

## 2021-08-10 DIAGNOSIS — M25521 Pain in right elbow: Secondary | ICD-10-CM | POA: Diagnosis not present

## 2021-08-10 DIAGNOSIS — S52121A Displaced fracture of head of right radius, initial encounter for closed fracture: Secondary | ICD-10-CM | POA: Insufficient documentation

## 2021-08-10 DIAGNOSIS — M6281 Muscle weakness (generalized): Secondary | ICD-10-CM | POA: Insufficient documentation

## 2021-08-10 DIAGNOSIS — M25641 Stiffness of right hand, not elsewhere classified: Secondary | ICD-10-CM | POA: Insufficient documentation
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
## 2021-08-25 ENCOUNTER — Ambulatory Visit: Payer: Medicare HMO

## 2021-08-25 ENCOUNTER — Encounter: Payer: Self-pay | Admitting: Orthopaedic Surgery

## 2021-08-25 ENCOUNTER — Ambulatory Visit: Payer: Medicare HMO | Admitting: Orthopaedic Surgery

## 2021-08-25 ENCOUNTER — Ambulatory Visit (INDEPENDENT_AMBULATORY_CARE_PROVIDER_SITE_OTHER): Payer: Medicare HMO

## 2021-08-25 VITALS — BP 153/78 | Ht 68.0 in | Wt 247.0 lb

## 2021-08-25 DIAGNOSIS — M25621 Stiffness of right elbow, not elsewhere classified: Secondary | ICD-10-CM

## 2021-08-25 DIAGNOSIS — S52121A Displaced fracture of head of right radius, initial encounter for closed fracture: Secondary | ICD-10-CM | POA: Diagnosis not present

## 2021-08-25 DIAGNOSIS — M25521 Pain in right elbow: Secondary | ICD-10-CM

## 2021-08-25 DIAGNOSIS — M25631 Stiffness of right wrist, not elsewhere classified: Secondary | ICD-10-CM

## 2021-08-25 DIAGNOSIS — M6281 Muscle weakness (generalized): Secondary | ICD-10-CM

## 2021-08-25 DIAGNOSIS — M25641 Stiffness of right hand, not elsewhere classified: Secondary | ICD-10-CM
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
## 2021-08-27 ENCOUNTER — Ambulatory Visit: Payer: Medicare HMO

## 2021-08-27 DIAGNOSIS — S52121A Displaced fracture of head of right radius, initial encounter for closed fracture: Secondary | ICD-10-CM | POA: Diagnosis not present

## 2021-08-27 DIAGNOSIS — M25521 Pain in right elbow: Secondary | ICD-10-CM

## 2021-08-27 DIAGNOSIS — M25641 Stiffness of right hand, not elsewhere classified: Secondary | ICD-10-CM

## 2021-08-27 DIAGNOSIS — M25621 Stiffness of right elbow, not elsewhere classified: Secondary | ICD-10-CM

## 2021-08-27 DIAGNOSIS — M6281 Muscle weakness (generalized): Secondary | ICD-10-CM
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
## 2021-08-28 ENCOUNTER — Ambulatory Visit (HOSPITAL_BASED_OUTPATIENT_CLINIC_OR_DEPARTMENT_OTHER): Payer: Medicare HMO | Admitting: Physical Therapy

## 2021-09-01 ENCOUNTER — Ambulatory Visit: Payer: Medicare HMO

## 2021-09-01 DIAGNOSIS — M25631 Stiffness of right wrist, not elsewhere classified: Secondary | ICD-10-CM

## 2021-09-01 DIAGNOSIS — M25521 Pain in right elbow: Secondary | ICD-10-CM

## 2021-09-01 DIAGNOSIS — S52121A Displaced fracture of head of right radius, initial encounter for closed fracture: Secondary | ICD-10-CM | POA: Diagnosis not present

## 2021-09-01 DIAGNOSIS — M25621 Stiffness of right elbow, not elsewhere classified: Secondary | ICD-10-CM

## 2021-09-01 DIAGNOSIS — M25641 Stiffness of right hand, not elsewhere classified: Secondary | ICD-10-CM

## 2021-09-01 DIAGNOSIS — M6281 Muscle weakness (generalized): Secondary | ICD-10-CM
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
## 2021-09-03 ENCOUNTER — Ambulatory Visit: Payer: Medicare HMO

## 2021-09-03 DIAGNOSIS — M6281 Muscle weakness (generalized): Secondary | ICD-10-CM

## 2021-09-03 DIAGNOSIS — M25641 Stiffness of right hand, not elsewhere classified: Secondary | ICD-10-CM

## 2021-09-03 DIAGNOSIS — M25631 Stiffness of right wrist, not elsewhere classified: Secondary | ICD-10-CM

## 2021-09-03 DIAGNOSIS — M25621 Stiffness of right elbow, not elsewhere classified: Secondary | ICD-10-CM

## 2021-09-03 DIAGNOSIS — S52121A Displaced fracture of head of right radius, initial encounter for closed fracture: Secondary | ICD-10-CM | POA: Diagnosis not present

## 2021-09-03 DIAGNOSIS — M25521 Pain in right elbow: Secondary | ICD-10-CM
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
## 2021-09-08 ENCOUNTER — Ambulatory Visit: Payer: Medicare HMO

## 2021-09-08 DIAGNOSIS — S52121A Displaced fracture of head of right radius, initial encounter for closed fracture: Secondary | ICD-10-CM | POA: Diagnosis not present

## 2021-09-08 DIAGNOSIS — M25641 Stiffness of right hand, not elsewhere classified: Secondary | ICD-10-CM

## 2021-09-08 DIAGNOSIS — M25521 Pain in right elbow: Secondary | ICD-10-CM

## 2021-09-08 DIAGNOSIS — M25621 Stiffness of right elbow, not elsewhere classified: Secondary | ICD-10-CM

## 2021-09-08 DIAGNOSIS — M6281 Muscle weakness (generalized): Secondary | ICD-10-CM

## 2021-09-08 DIAGNOSIS — M25631 Stiffness of right wrist, not elsewhere classified: Secondary | ICD-10-CM
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
## 2021-09-10 ENCOUNTER — Ambulatory Visit: Payer: Medicare HMO | Attending: Orthopaedic Surgery

## 2021-09-10 DIAGNOSIS — M6281 Muscle weakness (generalized): Secondary | ICD-10-CM | POA: Insufficient documentation

## 2021-09-10 DIAGNOSIS — M25621 Stiffness of right elbow, not elsewhere classified: Secondary | ICD-10-CM | POA: Insufficient documentation

## 2021-09-10 DIAGNOSIS — M25641 Stiffness of right hand, not elsewhere classified: Secondary | ICD-10-CM | POA: Insufficient documentation

## 2021-09-10 DIAGNOSIS — M25521 Pain in right elbow: Secondary | ICD-10-CM | POA: Diagnosis present

## 2021-09-10 DIAGNOSIS — M25631 Stiffness of right wrist, not elsewhere classified: Secondary | ICD-10-CM | POA: Diagnosis present
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
## 2021-09-14 ENCOUNTER — Ambulatory Visit: Payer: Medicare HMO

## 2021-09-14 DIAGNOSIS — M25521 Pain in right elbow: Secondary | ICD-10-CM

## 2021-09-14 DIAGNOSIS — M25641 Stiffness of right hand, not elsewhere classified: Secondary | ICD-10-CM

## 2021-09-14 DIAGNOSIS — M25631 Stiffness of right wrist, not elsewhere classified: Secondary | ICD-10-CM

## 2021-09-14 DIAGNOSIS — M25621 Stiffness of right elbow, not elsewhere classified: Secondary | ICD-10-CM

## 2021-09-14 DIAGNOSIS — M6281 Muscle weakness (generalized): Secondary | ICD-10-CM
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
## 2021-09-15 ENCOUNTER — Ambulatory Visit (INDEPENDENT_AMBULATORY_CARE_PROVIDER_SITE_OTHER): Payer: Medicare HMO

## 2021-09-15 ENCOUNTER — Ambulatory Visit: Payer: Medicare HMO | Admitting: Orthopaedic Surgery

## 2021-09-15 VITALS — BP 157/79 | HR 66

## 2021-09-15 DIAGNOSIS — S52121A Displaced fracture of head of right radius, initial encounter for closed fracture: Secondary | ICD-10-CM | POA: Diagnosis not present
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
## 2021-09-16 ENCOUNTER — Ambulatory Visit: Payer: Medicare HMO

## 2021-09-16 DIAGNOSIS — M25521 Pain in right elbow: Secondary | ICD-10-CM | POA: Diagnosis not present

## 2021-09-16 DIAGNOSIS — M6281 Muscle weakness (generalized): Secondary | ICD-10-CM

## 2021-09-16 DIAGNOSIS — M25641 Stiffness of right hand, not elsewhere classified: Secondary | ICD-10-CM

## 2021-09-16 DIAGNOSIS — M25631 Stiffness of right wrist, not elsewhere classified: Secondary | ICD-10-CM

## 2021-09-16 DIAGNOSIS — M25621 Stiffness of right elbow, not elsewhere classified: Secondary | ICD-10-CM
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
## 2021-09-21 ENCOUNTER — Ambulatory Visit: Payer: Medicare HMO

## 2021-09-21 DIAGNOSIS — M25521 Pain in right elbow: Secondary | ICD-10-CM

## 2021-09-21 DIAGNOSIS — M25631 Stiffness of right wrist, not elsewhere classified: Secondary | ICD-10-CM

## 2021-09-21 DIAGNOSIS — M6281 Muscle weakness (generalized): Secondary | ICD-10-CM

## 2021-09-21 DIAGNOSIS — M25641 Stiffness of right hand, not elsewhere classified: Secondary | ICD-10-CM

## 2021-09-21 DIAGNOSIS — M25621 Stiffness of right elbow, not elsewhere classified: Secondary | ICD-10-CM
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
## 2021-09-28 ENCOUNTER — Ambulatory Visit: Payer: Medicare HMO

## 2021-09-28 DIAGNOSIS — M25621 Stiffness of right elbow, not elsewhere classified: Secondary | ICD-10-CM

## 2021-09-28 DIAGNOSIS — M25631 Stiffness of right wrist, not elsewhere classified: Secondary | ICD-10-CM

## 2021-09-28 DIAGNOSIS — M25521 Pain in right elbow: Secondary | ICD-10-CM

## 2021-09-28 DIAGNOSIS — M6281 Muscle weakness (generalized): Secondary | ICD-10-CM

## 2021-09-28 DIAGNOSIS — M25641 Stiffness of right hand, not elsewhere classified: Secondary | ICD-10-CM
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
## 2021-10-05 ENCOUNTER — Ambulatory Visit: Payer: Medicare HMO

## 2021-10-05 DIAGNOSIS — M25521 Pain in right elbow: Secondary | ICD-10-CM

## 2021-10-05 DIAGNOSIS — M25641 Stiffness of right hand, not elsewhere classified: Secondary | ICD-10-CM

## 2021-10-05 DIAGNOSIS — M25631 Stiffness of right wrist, not elsewhere classified: Secondary | ICD-10-CM

## 2021-10-05 DIAGNOSIS — M6281 Muscle weakness (generalized): Secondary | ICD-10-CM

## 2021-10-05 DIAGNOSIS — M25621 Stiffness of right elbow, not elsewhere classified: Secondary | ICD-10-CM

## 2021-10-08 ENCOUNTER — Ambulatory Visit: Payer: Medicare HMO

## 2021-10-08 DIAGNOSIS — M25521 Pain in right elbow: Secondary | ICD-10-CM | POA: Diagnosis not present

## 2021-10-08 DIAGNOSIS — M25631 Stiffness of right wrist, not elsewhere classified: Secondary | ICD-10-CM

## 2021-10-08 DIAGNOSIS — M6281 Muscle weakness (generalized): Secondary | ICD-10-CM

## 2021-10-08 DIAGNOSIS — M25641 Stiffness of right hand, not elsewhere classified: Secondary | ICD-10-CM

## 2021-10-08 DIAGNOSIS — M25621 Stiffness of right elbow, not elsewhere classified: Secondary | ICD-10-CM
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
## 2021-10-12 ENCOUNTER — Ambulatory Visit: Payer: Medicare HMO | Attending: Orthopaedic Surgery

## 2021-10-12 DIAGNOSIS — M25631 Stiffness of right wrist, not elsewhere classified: Secondary | ICD-10-CM | POA: Insufficient documentation

## 2021-10-12 DIAGNOSIS — M6281 Muscle weakness (generalized): Secondary | ICD-10-CM | POA: Insufficient documentation

## 2021-10-12 DIAGNOSIS — M25521 Pain in right elbow: Secondary | ICD-10-CM | POA: Diagnosis not present

## 2021-10-12 DIAGNOSIS — M25641 Stiffness of right hand, not elsewhere classified: Secondary | ICD-10-CM | POA: Diagnosis present

## 2021-10-12 DIAGNOSIS — M25621 Stiffness of right elbow, not elsewhere classified: Secondary | ICD-10-CM | POA: Diagnosis present
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
## 2021-10-15 ENCOUNTER — Ambulatory Visit: Payer: Medicare HMO

## 2021-10-15 DIAGNOSIS — M25631 Stiffness of right wrist, not elsewhere classified: Secondary | ICD-10-CM

## 2021-10-15 DIAGNOSIS — M6281 Muscle weakness (generalized): Secondary | ICD-10-CM

## 2021-10-15 DIAGNOSIS — M25641 Stiffness of right hand, not elsewhere classified: Secondary | ICD-10-CM

## 2021-10-15 DIAGNOSIS — M25521 Pain in right elbow: Secondary | ICD-10-CM

## 2021-10-15 DIAGNOSIS — M25621 Stiffness of right elbow, not elsewhere classified: Secondary | ICD-10-CM
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
## 2021-10-21 ENCOUNTER — Ambulatory Visit: Payer: Medicare HMO

## 2021-10-22 ENCOUNTER — Ambulatory Visit: Payer: Medicare HMO

## 2021-10-22 DIAGNOSIS — M25521 Pain in right elbow: Secondary | ICD-10-CM | POA: Diagnosis not present

## 2021-10-22 DIAGNOSIS — M25641 Stiffness of right hand, not elsewhere classified: Secondary | ICD-10-CM

## 2021-10-22 DIAGNOSIS — M25631 Stiffness of right wrist, not elsewhere classified: Secondary | ICD-10-CM

## 2021-10-22 DIAGNOSIS — M25621 Stiffness of right elbow, not elsewhere classified: Secondary | ICD-10-CM

## 2021-10-22 DIAGNOSIS — M6281 Muscle weakness (generalized): Secondary | ICD-10-CM
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
## 2021-10-28 ENCOUNTER — Ambulatory Visit: Payer: Medicare HMO

## 2021-11-16 ENCOUNTER — Ambulatory Visit: Payer: Medicare HMO | Attending: Orthopaedic Surgery

## 2021-11-16 DIAGNOSIS — M25521 Pain in right elbow: Secondary | ICD-10-CM | POA: Insufficient documentation

## 2021-11-16 DIAGNOSIS — M25621 Stiffness of right elbow, not elsewhere classified: Secondary | ICD-10-CM | POA: Insufficient documentation

## 2021-11-16 DIAGNOSIS — M6281 Muscle weakness (generalized): Secondary | ICD-10-CM | POA: Diagnosis present

## 2021-11-16 DIAGNOSIS — M25641 Stiffness of right hand, not elsewhere classified: Secondary | ICD-10-CM | POA: Diagnosis present

## 2021-11-16 DIAGNOSIS — M25631 Stiffness of right wrist, not elsewhere classified: Secondary | ICD-10-CM | POA: Insufficient documentation
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
## 2021-11-17 ENCOUNTER — Ambulatory Visit: Payer: Medicare HMO | Admitting: Orthopaedic Surgery

## 2021-11-17 DIAGNOSIS — M1711 Unilateral primary osteoarthritis, right knee: Secondary | ICD-10-CM

## 2021-11-17 NOTE — Progress Notes (Unsigned)
   Office Visit Note   Patient: Jillian Murray           Date of Birth: 1951-01-25           MRN: [id number] Visit Date: 11/17/2021              Requested by: Rudene Anda, MD [id number] [place],  [place] [address] PCP: Rudene Anda, MD   Assessment & Plan: Visit Diagnoses: No diagnosis found.  Plan: ***  Follow-Up Instructions: No follow-ups on file.   Orders:  No orders of the defined types were placed in this encounter.  No orders of the defined types were placed in this encounter.     Procedures: No procedures performed   Clinical Data: No additional findings.   Subjective: Chief Complaint  Patient presents with   Right Knee - Pain    HPI  Review of Systems   Objective: Vital Signs: There were no vitals taken for this visit.  Physical Exam  Ortho Exam  Specialty Comments:  No specialty comments available.  Imaging: No results found.   PMFS History: Patient Active Problem List   Diagnosis Date Noted   Radial head fracture, closed 07/12/2021   Sacral contusion 07/12/2021   H/O renal cell carcinoma 07/08/2021   Past Medical History:  Diagnosis Date   Cancer [facility])    H/O renal cell carcinoma    Hypertension    Renal disorder     No family history on file.  Past Surgical History:  Procedure Laterality Date   NEPHRECTOMY     Social History   Occupational History   Not on file  Tobacco Use   Smoking status: Never   Smokeless tobacco: Never  Vaping Use   Vaping Use: Never used  Substance and Sexual Activity   Alcohol use: Not on file   Drug use: Not on file   Sexual activity: Not on file

## 2021-11-18 DIAGNOSIS — M1711 Unilateral primary osteoarthritis, right knee: Secondary | ICD-10-CM | POA: Diagnosis not present

## 2021-11-18 MED ORDER — LIDOCAINE HCL 1 % IJ SOLN
0.5000 mL | INTRAMUSCULAR | Status: AC | PRN
  Administered 2021-11-18: .5 mL

## 2021-11-18 MED ORDER — METHYLPREDNISOLONE ACETATE 40 MG/ML IJ SUSP
40.0000 mg | INTRAMUSCULAR | Status: AC | PRN
  Administered 2021-11-18: 40 mg via INTRA_ARTICULAR

## 2021-11-18 MED ORDER — BUPIVACAINE HCL 0.25 % IJ SOLN
4.0000 mL | INTRAMUSCULAR | Status: AC | PRN
  Administered 2021-11-18: 4 mL via INTRA_ARTICULAR

## 2021-11-30 DIAGNOSIS — G4486 Cervicogenic headache: Secondary | ICD-10-CM | POA: Insufficient documentation

## 2021-11-30 DIAGNOSIS — G8929 Other chronic pain: Secondary | ICD-10-CM | POA: Insufficient documentation

## 2021-11-30 DIAGNOSIS — R7303 Prediabetes: Secondary | ICD-10-CM | POA: Insufficient documentation

## 2021-12-28 ENCOUNTER — Ambulatory Visit: Payer: Medicare HMO | Attending: Physician Assistant

## 2021-12-28 ENCOUNTER — Other Ambulatory Visit: Payer: Self-pay

## 2021-12-28 DIAGNOSIS — M5459 Other low back pain: Secondary | ICD-10-CM | POA: Diagnosis present

## 2021-12-28 DIAGNOSIS — M6281 Muscle weakness (generalized): Secondary | ICD-10-CM | POA: Insufficient documentation

## 2021-12-28 DIAGNOSIS — M25551 Pain in right hip: Secondary | ICD-10-CM | POA: Diagnosis not present

## 2021-12-28 DIAGNOSIS — R252 Cramp and spasm: Secondary | ICD-10-CM

## 2021-12-28 NOTE — Patient Instructions (Signed)
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
## 2021-12-30 ENCOUNTER — Ambulatory Visit: Payer: Medicare HMO

## 2021-12-30 DIAGNOSIS — M25551 Pain in right hip: Secondary | ICD-10-CM

## 2021-12-30 DIAGNOSIS — M5459 Other low back pain: Secondary | ICD-10-CM | POA: Diagnosis not present

## 2021-12-30 DIAGNOSIS — M6281 Muscle weakness (generalized): Secondary | ICD-10-CM

## 2021-12-30 DIAGNOSIS — R252 Cramp and spasm: Secondary | ICD-10-CM
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
## 2022-01-04 ENCOUNTER — Ambulatory Visit: Payer: Medicare HMO

## 2022-01-04 DIAGNOSIS — M25551 Pain in right hip: Secondary | ICD-10-CM

## 2022-01-04 DIAGNOSIS — M5459 Other low back pain: Secondary | ICD-10-CM

## 2022-01-04 DIAGNOSIS — R252 Cramp and spasm: Secondary | ICD-10-CM

## 2022-01-04 DIAGNOSIS — M6281 Muscle weakness (generalized): Secondary | ICD-10-CM
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
## 2022-01-07 ENCOUNTER — Ambulatory Visit: Payer: Medicare HMO

## 2022-01-07 DIAGNOSIS — R252 Cramp and spasm: Secondary | ICD-10-CM

## 2022-01-07 DIAGNOSIS — M6281 Muscle weakness (generalized): Secondary | ICD-10-CM

## 2022-01-07 DIAGNOSIS — M5459 Other low back pain: Secondary | ICD-10-CM

## 2022-01-07 DIAGNOSIS — M25551 Pain in right hip: Secondary | ICD-10-CM
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
## 2022-01-12 ENCOUNTER — Ambulatory Visit: Payer: Medicare HMO | Attending: Physician Assistant

## 2022-01-12 DIAGNOSIS — M5459 Other low back pain: Secondary | ICD-10-CM | POA: Insufficient documentation

## 2022-01-12 DIAGNOSIS — M25551 Pain in right hip: Secondary | ICD-10-CM | POA: Diagnosis present

## 2022-01-12 DIAGNOSIS — R252 Cramp and spasm: Secondary | ICD-10-CM | POA: Diagnosis present

## 2022-01-12 DIAGNOSIS — M6281 Muscle weakness (generalized): Secondary | ICD-10-CM | POA: Insufficient documentation
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
## 2022-01-14 ENCOUNTER — Ambulatory Visit: Payer: Medicare HMO

## 2022-01-14 DIAGNOSIS — M25551 Pain in right hip: Secondary | ICD-10-CM

## 2022-01-14 DIAGNOSIS — M6281 Muscle weakness (generalized): Secondary | ICD-10-CM | POA: Diagnosis not present

## 2022-01-14 DIAGNOSIS — M5459 Other low back pain: Secondary | ICD-10-CM

## 2022-01-14 DIAGNOSIS — R252 Cramp and spasm: Secondary | ICD-10-CM
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
## 2022-01-18 ENCOUNTER — Ambulatory Visit: Payer: Medicare HMO

## 2022-01-18 DIAGNOSIS — M25551 Pain in right hip: Secondary | ICD-10-CM

## 2022-01-18 DIAGNOSIS — M6281 Muscle weakness (generalized): Secondary | ICD-10-CM

## 2022-01-18 DIAGNOSIS — R252 Cramp and spasm: Secondary | ICD-10-CM

## 2022-01-18 DIAGNOSIS — M5459 Other low back pain: Secondary | ICD-10-CM
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
## 2022-01-20 ENCOUNTER — Ambulatory Visit: Payer: Medicare HMO

## 2022-01-20 DIAGNOSIS — M5459 Other low back pain: Secondary | ICD-10-CM

## 2022-01-20 DIAGNOSIS — M6281 Muscle weakness (generalized): Secondary | ICD-10-CM

## 2022-01-20 DIAGNOSIS — M25551 Pain in right hip: Secondary | ICD-10-CM

## 2022-01-20 DIAGNOSIS — R252 Cramp and spasm: Secondary | ICD-10-CM
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
## 2022-01-25 ENCOUNTER — Ambulatory Visit: Payer: Medicare HMO

## 2022-01-25 DIAGNOSIS — M5459 Other low back pain: Secondary | ICD-10-CM

## 2022-01-25 DIAGNOSIS — R252 Cramp and spasm: Secondary | ICD-10-CM

## 2022-01-25 DIAGNOSIS — M25551 Pain in right hip: Secondary | ICD-10-CM

## 2022-01-25 DIAGNOSIS — M6281 Muscle weakness (generalized): Secondary | ICD-10-CM | POA: Diagnosis not present
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
## 2022-01-27 ENCOUNTER — Ambulatory Visit: Payer: Medicare HMO

## 2022-01-27 DIAGNOSIS — M6281 Muscle weakness (generalized): Secondary | ICD-10-CM

## 2022-01-27 DIAGNOSIS — M5459 Other low back pain: Secondary | ICD-10-CM

## 2022-01-27 DIAGNOSIS — R252 Cramp and spasm: Secondary | ICD-10-CM

## 2022-01-27 DIAGNOSIS — M25551 Pain in right hip: Secondary | ICD-10-CM
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
## 2022-02-24 ENCOUNTER — Ambulatory Visit: Payer: Medicare HMO | Attending: Physician Assistant

## 2022-02-24 DIAGNOSIS — M6281 Muscle weakness (generalized): Secondary | ICD-10-CM | POA: Diagnosis not present

## 2022-02-24 DIAGNOSIS — M5459 Other low back pain: Secondary | ICD-10-CM

## 2022-02-24 DIAGNOSIS — M25551 Pain in right hip: Secondary | ICD-10-CM

## 2022-02-24 DIAGNOSIS — R252 Cramp and spasm: Secondary | ICD-10-CM | POA: Diagnosis present
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
## 2022-03-08 DIAGNOSIS — E538 Deficiency of other specified B group vitamins: Secondary | ICD-10-CM | POA: Insufficient documentation

## 2022-04-14 DIAGNOSIS — M109 Gout, unspecified: Secondary | ICD-10-CM | POA: Insufficient documentation
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
## 2022-12-27 ENCOUNTER — Encounter: Payer: Self-pay | Admitting: Nurse Practitioner

## 2022-12-27 ENCOUNTER — Ambulatory Visit: Payer: Medicare HMO | Admitting: Nurse Practitioner

## 2022-12-27 VITALS — BP 140/83 | HR 55 | Temp 98.0°F | Ht 68.0 in | Wt 243.0 lb

## 2022-12-27 DIAGNOSIS — E538 Deficiency of other specified B group vitamins: Secondary | ICD-10-CM

## 2022-12-27 DIAGNOSIS — E559 Vitamin D deficiency, unspecified: Secondary | ICD-10-CM

## 2022-12-27 DIAGNOSIS — G4733 Obstructive sleep apnea (adult) (pediatric): Secondary | ICD-10-CM

## 2022-12-27 DIAGNOSIS — E669 Obesity, unspecified: Secondary | ICD-10-CM

## 2022-12-27 DIAGNOSIS — Z6836 Body mass index (BMI) 36.0-36.9, adult: Secondary | ICD-10-CM

## 2022-12-27 DIAGNOSIS — R7303 Prediabetes: Secondary | ICD-10-CM | POA: Diagnosis not present

## 2022-12-27 DIAGNOSIS — L821 Other seborrheic keratosis: Secondary | ICD-10-CM | POA: Insufficient documentation

## 2022-12-27 DIAGNOSIS — I151 Hypertension secondary to other renal disorders: Secondary | ICD-10-CM

## 2022-12-27 DIAGNOSIS — Z0289 Encounter for other administrative examinations: Secondary | ICD-10-CM

## 2022-12-27 NOTE — Progress Notes (Signed)
Office: [phone]  /  Fax: [phone]   Initial Visit  Jillian Murray was seen in clinic today to evaluate for obesity. She is interested in losing weight to improve overall health and reduce the risk of weight related complications. She presents today to review program treatment options, initial physical assessment, and evaluation.     She was referred by: Friend or Family  When asked what else they would like to accomplish? She states: Improve energy levels and physical activity, Improve existing medical conditions, Improve quality of life, and Lose a target amount of weight : 80 lbs  Goal is to lose weight to have a total right knee replacement.    Goal weight 160s   Weight history:  She started gaining weight prior to having her second child in her 29s.  She has struggled with her weight over the years.     When asked how has your weight affected you? She states: Contributed to medical problems, Having fatigue, and Having poor endurance, right knee pain  Some associated conditions: OSAS on CPAP, gout, HTN, renal cell cancer-one kidney, HA, OA, B12 def, Vit D def  Contributing factors: Family history, Nutritional, Medications, Life event, and Pregnancy  Weight promoting medications identified: unsure  Current nutrition plan: None  Current level of physical activity: chair yoga 2 days per week-hates going to the gym and exercising.    Current or previous pharmacotherapy: None  Response to medication: Never tried medications   Past medical history includes:   Past Medical History:  Diagnosis Date   Cancer (HCC)    H/O renal cell carcinoma    Hypertension    Renal disorder      Objective:   BP (!) 140/83   Pulse (!) 55   Temp 98 F (36.7 C)   Ht 5\' 8"  (1.727 m)   Wt 243 lb (110.2 kg)   LMP  (LMP Unknown)   SpO2 98%   BMI 36.95 kg/m  She was weighed on the bioimpedance scale: Body mass index is 36.95 kg/m.  Peak Weight:243 lbs , Body Fat%:48.5%, Visceral  Fat Rating:16, Weight trend over the last 12 months: Unchanged  General:  Alert, oriented and cooperative. Patient is in no acute distress.  Respiratory: Normal respiratory effort, no problems with respiration noted   Gait: able to ambulate independently  Mental Status: Normal mood and affect. Normal behavior. Normal judgment and thought content.   DIAGNOSTIC DATA REVIEWED:  BMET No results found for: "NA", "K", "CL", "CO2", "GLUCOSE", "BUN", "CREATININE", "CALCIUM", "GFRNONAA", "GFRAA" No results found for: "HGBA1C" No results found for: "INSULIN" CBC No results found for: "WBC", "RBC", "HGB", "HCT", "PLT", "MCV", "MCH", "MCHC", "RDW" Iron/TIBC/Ferritin/ %Sat No results found for: "IRON", "TIBC", "FERRITIN", "IRONPCTSAT" Lipid Panel  No results found for: "CHOL", "TRIG", "HDL", "CHOLHDL", "VLDL", "LDLCALC", "LDLDIRECT" Hepatic Function Panel  No results found for: "PROT", "ALBUMIN", "AST", "ALT", "ALKPHOS", "BILITOT", "BILIDIR", "IBILI" No results found for: "TSH"   Assessment and Plan:   Obstructive sleep apnea syndrome Continue CPAP nightly  Hypertension secondary to other renal disorders Continue to follow up with nephrology and PCP.  Continue meds as directed  B12 deficiency Will continue to monitor  Prediabetes Will continue to monitor  Vitamin D insufficiency Will continue to monitor   Generalized obesity  BMI 36.0-36.9,adult        Obesity Treatment / Action Plan:  Patient will work on garnering support from family and friends to begin weight loss journey. Will work on eliminating or reducing the  presence of highly palatable, calorie dense foods in the home. Will complete provided nutritional and psychosocial assessment questionnaire before the next appointment. Will be scheduled for indirect calorimetry to determine resting energy expenditure in a fasting state.  This will allow [place] to create a reduced calorie, high-protein meal plan to promote loss of  fat mass while preserving muscle mass. Counseled on the health benefits of losing 5%-15% of total body weight. Was counseled on nutritional approaches to weight loss and benefits of reducing processed foods and consuming plant-based foods and high quality protein as part of nutritional weight management. Was counseled on pharmacotherapy and role as an adjunct in weight management.   Obesity Education Performed Today:  She was weighed on the bioimpedance scale and results were discussed and documented in the synopsis.  We discussed obesity as a disease and the importance of a more detailed evaluation of all the factors contributing to the disease.  We discussed the importance of long term lifestyle changes which include nutrition, exercise and behavioral modifications as well as the importance of customizing this to her specific health and social needs.  We discussed the benefits of reaching a healthier weight to alleviate the symptoms of existing conditions and reduce the risks of the biomechanical, metabolic and psychological effects of obesity.  Seanna Daisey appears to be in the action stage of change and states they are ready to start intensive lifestyle modifications and behavioral modifications.  30 minutes was spent today on this visit including the above counseling, pre-visit chart review, and post-visit documentation.  Reviewed by clinician on day of visit: allergies, medications, problem list, medical history, surgical history, family history, social history, and previous encounter notes pertinent to obesity diagnosis.    Theodis Sato Tim Wilhide FNP-C

## 2022-12-28 ENCOUNTER — Ambulatory Visit: Payer: Medicare HMO | Admitting: Bariatrics

## 2022-12-28 ENCOUNTER — Encounter: Payer: Self-pay | Admitting: Bariatrics

## 2022-12-28 VITALS — BP 152/72 | HR 60 | Temp 97.8°F | Ht 68.0 in | Wt 244.0 lb

## 2022-12-28 DIAGNOSIS — Z0000 Encounter for general adult medical examination without abnormal findings: Secondary | ICD-10-CM | POA: Insufficient documentation

## 2022-12-28 DIAGNOSIS — I151 Hypertension secondary to other renal disorders: Secondary | ICD-10-CM

## 2022-12-28 DIAGNOSIS — E559 Vitamin D deficiency, unspecified: Secondary | ICD-10-CM

## 2022-12-28 DIAGNOSIS — R632 Polyphagia: Secondary | ICD-10-CM

## 2022-12-28 DIAGNOSIS — R0602 Shortness of breath: Secondary | ICD-10-CM | POA: Diagnosis not present

## 2022-12-28 DIAGNOSIS — E538 Deficiency of other specified B group vitamins: Secondary | ICD-10-CM | POA: Diagnosis not present

## 2022-12-28 DIAGNOSIS — E669 Obesity, unspecified: Secondary | ICD-10-CM

## 2022-12-28 DIAGNOSIS — Z6837 Body mass index (BMI) 37.0-37.9, adult: Secondary | ICD-10-CM

## 2022-12-28 DIAGNOSIS — Z1331 Encounter for screening for depression: Secondary | ICD-10-CM | POA: Diagnosis not present

## 2022-12-28 DIAGNOSIS — R5383 Other fatigue: Secondary | ICD-10-CM | POA: Diagnosis not present

## 2022-12-28 DIAGNOSIS — R7303 Prediabetes: Secondary | ICD-10-CM

## 2022-12-29 ENCOUNTER — Encounter: Payer: Self-pay | Admitting: Bariatrics

## 2022-12-29 DIAGNOSIS — E88819 Insulin resistance, unspecified: Secondary | ICD-10-CM | POA: Insufficient documentation

## 2022-12-30 NOTE — Progress Notes (Signed)
Chief Complaint:   OBESITY Jillian Murray (MR# [id number]) is a 72 y.o. female who presents for evaluation and treatment of obesity and related comorbidities. Current BMI is Body mass index is 37.1 kg/m. Jillian Murray has been struggling with her weight for many years and has been unsuccessful in either losing weight, maintaining weight loss, or reaching her healthy weight goal.  Jillian Murray is currently in the action stage of change and ready to dedicate time achieving and maintaining a healthier weight. Jillian Murray is interested in becoming our patient and working on intensive lifestyle modifications including (but not limited to) diet and exercise for weight loss.  Patient had an information visit yesterday with [name], NP.   Jillian Murray's habits were reviewed today and are as follows: Her family eats meals together, she thinks her family will eat healthier with her, her desired weight loss is 84 lbs, she started gaining weight at 72 years old, her heaviest weight ever was 245 pounds, she has significant food cravings issues, she skips meals frequently, she is frequently drinking liquids with calories, she frequently makes poor food choices, she has problems with excessive hunger, she frequently eats larger portions than normal, and she struggles with emotional eating.  Depression Screen Jillian Murray's Food and Mood (modified PHQ-9) score was 18.  Subjective:   1. Other fatigue Jillian Murray admits to daytime somnolence and admits to waking up still tired. Patient has a history of symptoms of daytime fatigue, morning fatigue, and morning headache. Jillian Murray generally gets 7 hours of sleep per night, and states that she has nightime awakenings and generally restful sleep. Jillian Murray is present. Apneic episodes are present. Epworth Sleepiness Score is 3.   2. SOB (shortness of breath) on exertion Jillian Murray notes increasing shortness of breath with exercising and seems to be worsening over time with weight gain. She notes  getting out of breath sooner with activity than she used to. This has not gotten worse recently. Jillian Murray denies shortness of breath at rest or orthopnea.  3. Prediabetes Patient has a strong family history.  She is not on medications currently.  4. Hypertension secondary to other renal disorders Patient is taking hydrochlorothiazide and Benicar, and her blood pressure is reasonably well-controlled.  5. B12 deficiency Patient is taking B12 supplementation.  6. Health care maintenance Given obesity.   7. Vitamin D insufficiency Patient is taking high-dose vitamin D.  8. Polyphagia Patient notes increased food noise, and states she is addicted to chocolate.  Assessment/Plan:   1. Other fatigue Jillian Murray does feel that her weight is causing her energy to be lower than it should be. Fatigue may be related to obesity, depression or many other causes. Labs will be ordered, and in the meanwhile, Jillian Murray will focus on self care including making healthy food choices, increasing physical activity and focusing on stress reduction.  - EKG 12-Lead - TSH  2. SOB (shortness of breath) on exertion Jillian Murray does feel that she gets out of breath more easily that she used to when she exercises. Jillian Murray's shortness of breath appears to be obesity related and exercise induced. She has agreed to work on weight loss and gradually increase exercise to treat her exercise induced shortness of breath. Will continue to monitor closely.  - TSH  3. Prediabetes We will check labs today, and we will follow-up at patient's next visit.  - Hemoglobin A1c - Insulin, random - Comprehensive metabolic panel - CBC with Differential/Platelet  4. Hypertension secondary to other renal disorders We will check labs  today.  Patient will continue her medications, and will check her blood pressure periodically.  - Comprehensive metabolic panel - CBC with Differential/Platelet  5. B12 deficiency We will check labs today.  Patient  will continue her B12 supplementation.  - Vitamin B12  6. Health care maintenance We will check labs today, and we will follow-up at her next visit. EKG and IC were done today and reviewed with the patient.   - Hemoglobin A1c - Insulin, random - Vitamin B12 - Lipid Panel With LDL/HDL Ratio - VITAMIN D 25 Hydroxy (Vit-D Deficiency, Fractures) - TSH - Comprehensive metabolic panel - CBC with Differential/Platelet  7. Vitamin D insufficiency We will check labs today.  Patient will continue her vitamin D supplementation.  - VITAMIN D 25 Hydroxy (Vit-D Deficiency, Fractures)  8. Polyphagia Patient was referred to Dr. Dewaine Conger, our Bariatric Psychologist, for evaluation. We discussed possible medication options.   9. Depression screening Jillian Murray had a positive depression screening. Depression is commonly associated with obesity and often results in emotional eating behaviors. We will monitor this closely and work on CBT to help improve the non-hunger eating patterns. Referral to Psychology may be required if no improvement is seen as she continues in our clinic.  10. Generalized obesity  11. BMI 37.0-37.9, adult Jillian Murray is currently in the action stage of change and her goal is to continue with weight loss efforts. I recommend Jillian Murray begin the structured treatment plan as follows:  She has agreed to the Category 2 Plan.  Meal planning was discussed.  Patient has no recent labs to discuss.  Practice tips for travel handout was given.  Exercise goals: No exercise has been prescribed at this time.   Behavioral modification strategies: increasing lean protein intake, decreasing simple carbohydrates, increasing vegetables, increasing water intake, decreasing eating out, no skipping meals, meal planning and cooking strategies, keeping healthy foods in the home, and planning for success.  She was informed of the importance of frequent follow-up visits to maximize her success with intensive  lifestyle modifications for her multiple health conditions. She was informed we would discuss her lab results at her next visit unless there is a critical issue that needs to be addressed sooner. Jillian Murray agreed to keep her next visit at the agreed upon time to discuss these results.  Objective:   Blood pressure (!) 152/72, pulse 60, temperature 97.8 F (36.6 C), height 5\' 8"  (1.727 m), weight 244 lb (110.7 kg), SpO2 99%. Body mass index is 37.1 kg/m.  EKG: Normal sinus rhythm, rate 55 BPM.  Indirect Calorimeter completed today shows a VO2 of 231 and a REE of 1598.  Her calculated basal metabolic rate is 6578 thus her basal metabolic rate is worse than expected.  General: Cooperative, alert, well developed, in no acute distress. HEENT: Conjunctivae and lids unremarkable. Cardiovascular: Regular rhythm.  Lungs: Normal work of breathing. Neurologic: No focal deficits.   Lab Results  Component Value Date   CREATININE 1.21 (H) 12/28/2022   BUN 21 12/28/2022   NA 139 12/28/2022   K 4.7 12/28/2022   CL 105 12/28/2022   CO2 22 12/28/2022   Lab Results  Component Value Date   ALT 27 12/28/2022   AST 21 12/28/2022   ALKPHOS 97 12/28/2022   BILITOT 0.4 12/28/2022   Lab Results  Component Value Date   HGBA1C 6.2 (H) 12/28/2022   Lab Results  Component Value Date   INSULIN 41.7 (H) 12/28/2022   Lab Results  Component Value Date  TSH 3.630 12/28/2022   Lab Results  Component Value Date   CHOL 188 12/28/2022   HDL 48 12/28/2022   LDLCALC 116 (H) 12/28/2022   TRIG 132 12/28/2022   Lab Results  Component Value Date   WBC 6.1 12/28/2022   HGB 12.8 12/28/2022   HCT 39.8 12/28/2022   MCV 90 12/28/2022   PLT 238 12/28/2022   No results found for: "IRON", "TIBC", "FERRITIN"  Attestation Statements:   Reviewed by clinician on day of visit: allergies, medications, problem list, medical history, surgical history, family history, social history, and previous encounter  notes.   Time spent on visit including pre-visit chart review and post-visit charting and care was  50 minutes.    Trude Mcburney, am acting as Energy manager for [employer], DO.  I have reviewed the above documentation for accuracy and completeness, and I agree with the above. -

## 2023-01-11 ENCOUNTER — Encounter: Payer: Self-pay | Admitting: Family Medicine

## 2023-01-11 ENCOUNTER — Ambulatory Visit: Payer: Medicare HMO | Admitting: Family Medicine

## 2023-01-11 VITALS — BP 145/80 | HR 52 | Temp 97.8°F | Ht 68.0 in | Wt 239.0 lb

## 2023-01-11 DIAGNOSIS — M1711 Unilateral primary osteoarthritis, right knee: Secondary | ICD-10-CM

## 2023-01-11 DIAGNOSIS — E559 Vitamin D deficiency, unspecified: Secondary | ICD-10-CM

## 2023-01-11 DIAGNOSIS — N1831 Chronic kidney disease, stage 3a: Secondary | ICD-10-CM | POA: Diagnosis not present

## 2023-01-11 DIAGNOSIS — E88819 Insulin resistance, unspecified: Secondary | ICD-10-CM

## 2023-01-11 DIAGNOSIS — Z6836 Body mass index (BMI) 36.0-36.9, adult: Secondary | ICD-10-CM

## 2023-01-11 DIAGNOSIS — E66812 Obesity, class 2: Secondary | ICD-10-CM

## 2023-01-11 DIAGNOSIS — E538 Deficiency of other specified B group vitamins: Secondary | ICD-10-CM | POA: Diagnosis not present

## 2023-01-11 DIAGNOSIS — Z9189 Other specified personal risk factors, not elsewhere classified: Secondary | ICD-10-CM | POA: Insufficient documentation

## 2023-01-11 DIAGNOSIS — R7303 Prediabetes: Secondary | ICD-10-CM | POA: Diagnosis not present

## 2023-01-11 DIAGNOSIS — R632 Polyphagia: Secondary | ICD-10-CM

## 2023-01-11 NOTE — Progress Notes (Signed)
Office: [phone]  /  Fax: [phone]  WEIGHT SUMMARY AND BIOMETRICS  Starting Date: 12/28/22  Starting Weight: 244lb   Weight Lost Since Last Visit: 5lb   Vitals Temp: 97.8 F (36.6 C) BP: (!) 145/80 Pulse Rate: (!) 52 SpO2: 99 %   Body Composition  Body Fat %: 47.9 % Fat Mass (lbs): 114.8 lbs Muscle Mass (lbs): 118.6 lbs Total Body Water (lbs): 89.6 lbs Visceral Fat Rating : 15    HPI  Chief Complaint: OBESITY  Jillian Murray is here to discuss her progress with her obesity treatment plan. She is on the the Category 2 Plan and states she is following her eating plan approximately 90 % of the time. She states she is exercising 0 minutes 0 times per week.   Interval History:  Since last office visit she is down 5 lb She is down 3.8 lb of body fat and down 1 lb of muscle mass in the past 2 weeks She always feels hungry but is consistent with breakfast and lunch She is out of the house for late lunch, eating out with husband on some days She has always been extremely averse to eating fruits AND veggies She reports never feeling full and is ALWAYS hungry She feels addicted to chocolate but has kept it out of the house Exercise is limited due to [name] pain in knee and she is awaiting R TKR She has an upcoming trip to [place] for 3 weeks  Pharmacotherapy: none  PHYSICAL EXAM:  Blood pressure (!) 145/80, pulse (!) 52, temperature 97.8 F (36.6 C), height 5\' 8"  (1.727 m), weight 239 lb (108.4 kg), SpO2 99%. Body mass index is 36.34 kg/m.  General: She is overweight, cooperative, alert, well developed, and in no acute distress. PSYCH: Has normal mood, affect and thought process.   Lungs: Normal breathing effort, no conversational dyspnea.   ASSESSMENT AND PLAN  TREATMENT PLAN FOR OBESITY:  Recommended Dietary Goals  Jillian Murray is currently in the action stage of change. As such, her goal is to continue weight management plan. She has agreed to the Category 2  Plan.  Behavioral Intervention  We discussed the following Behavioral Modification Strategies today: increasing lean protein intake, decreasing simple carbohydrates , increasing vegetables, increasing lower glycemic fruits, increasing fiber rich foods, avoiding skipping meals, increasing water intake, work on meal planning and preparation, keeping healthy foods at home, avoiding temptations and identifying enticing environmental cues, continue to work on implementation of reduced calorie nutritional plan, continue to practice mindfulness when eating, and planning for success.  Additional resources provided today: NA  Recommended Physical Activity Goals  Jillian Murray has been advised to work up to 150 minutes of moderate intensity aerobic activity a week and strengthening exercises 2-3 times per week for cardiovascular health, weight loss maintenance and preservation of muscle mass.   She has agreed to Think about ways to increase daily physical activity and overcoming barriers to exercise - chair exercises at home 2-3 days/ wk - consider using her [employer] gym membership for water exercise once a week  Pharmacotherapy changes for the treatment of obesity: none  ASSOCIATED CONDITIONS ADDRESSED TODAY  Insulin resistance Assessment & Plan: Severe.  Likely explains her excessive hunger and cravings. Explained how IR contributes to hunger and body fat deposition She has started to reduce her intake of starches and sweets but has not increased physical activity to help with insulin sensitivity.  Continue to work on lifestyle changes Hold off on adding metformin with her CKD 3a  CKD stage G3a/A1, GFR 45-59 and albumin creatinine ratio <30 mg/g [name]) Assessment & Plan: Lab Results  Component Value Date   NA 139 12/28/2022   K 4.7 12/28/2022   CO2 22 12/28/2022   GLUCOSE 115 (H) 12/28/2022   BUN 21 12/28/2022   CREATININE 1.21 (H) 12/28/2022   CALCIUM 9.4 12/28/2022   EGFR 48 (L) 12/28/2022    Reviewed lab results with pt.  She is s/p nephrectomy for RCC with stable renal function.  Denies leg edema or change in voiding.  She avoids NSAIDs.    Continue to focus on healthy lifestyle changes Keep total daily protein target ~85 g / day Hydrate well with water Avoid use of nephrotoxic medications   Prediabetes Assessment & Plan: Lab Results  Component Value Date   HGBA1C 6.2 (H) 12/28/2022   Reviewed lab results with pt She has + fam hx of T2DM and worries about this getting worse She has recently made changes to her diet though is very resistance to adding in non starchy veggies and is not ready to adding regular exercise  Discussed the importance of dietary change and exercise in the treatment of prediabetes Continue active plan for weight reduction   Class 2 severe obesity due to excess calories with serious comorbidity and body mass index (BMI) of 36.0 to 36.9 in adult [name])  B12 deficiency Assessment & Plan: Lab Results  Component Value Date   VITAMINB12 1,646 (H) 12/28/2022   Reviewed lab results. She has been taking B12 1000 mcg daily Energy level fair  Change B12 1,000 mcg to 5 days/ wk   Vitamin D insufficiency Assessment & Plan: Last vitamin D Lab Results  Component Value Date   VD25OH 42.4 12/28/2022    She is on a vitamin D supplement  Continue vitamin D thru PCP and recheck q 6 mos   Polyphagia  Sedentary lifestyle  Unilateral primary osteoarthritis, right knee Assessment & Plan: Pain limits walking time and she plans to proceed with R TKR in the next 6 mos She cannot take NSAIDs for pain  We discussed adding more chair exercises and water exercise       She was informed of the importance of frequent follow up visits to maximize her success with intensive lifestyle modifications for her multiple health conditions.   ATTESTASTION STATEMENTS:  Reviewed by clinician on day of visit: allergies, medications, problem list, medical  history, surgical history, family history, social history, and previous encounter notes pertinent to obesity diagnosis.   I have personally spent 30 minutes total time today in preparation, patient care, nutritional counseling and documentation for this visit, including the following: review of clinical lab tests; review of medical tests/procedures/services.      Glennis Brink, DO DABFM, DABOM [facility] [address] [place], [place] [address] [phone]

## 2023-01-11 NOTE — Assessment & Plan Note (Signed)
Pain limits walking time and she plans to proceed with R TKR in the next 6 mos She cannot take NSAIDs for pain  We discussed adding more chair exercises and water exercise

## 2023-01-11 NOTE — Assessment & Plan Note (Signed)
Severe.  Likely explains her excessive hunger and cravings. Explained how IR contributes to hunger and body fat deposition She has started to reduce her intake of starches and sweets but has not increased physical activity to help with insulin sensitivity.  Continue to work on lifestyle changes Hold off on adding metformin with her CKD 3a

## 2023-01-11 NOTE — Assessment & Plan Note (Signed)
Lab Results  Component Value Date   NA 139 12/28/2022   K 4.7 12/28/2022   CO2 22 12/28/2022   GLUCOSE 115 (H) 12/28/2022   BUN 21 12/28/2022   CREATININE 1.21 (H) 12/28/2022   CALCIUM 9.4 12/28/2022   EGFR 48 (L) 12/28/2022   Reviewed lab results with pt.  She is s/p nephrectomy for RCC with stable renal function.  Denies leg edema or change in voiding.  She avoids NSAIDs.    Continue to focus on healthy lifestyle changes Keep total daily protein target ~85 g / day Hydrate well with water Avoid use of nephrotoxic medications

## 2023-01-11 NOTE — Assessment & Plan Note (Signed)
Lab Results  Component Value Date   HGBA1C 6.2 (H) 12/28/2022   Reviewed lab results with pt She has + fam hx of T2DM and worries about this getting worse She has recently made changes to her diet though is very resistance to adding in non starchy veggies and is not ready to adding regular exercise  Discussed the importance of dietary change and exercise in the treatment of prediabetes Continue active plan for weight reduction

## 2023-01-11 NOTE — Patient Instructions (Signed)
Move your Vitamin B12 1,000 mcg to M-F only

## 2023-01-11 NOTE — Assessment & Plan Note (Signed)
Last vitamin D Lab Results  Component Value Date   VD25OH 42.4 12/28/2022    She is on a vitamin D supplement  Continue vitamin D thru PCP and recheck q 6 mos

## 2023-01-11 NOTE — Assessment & Plan Note (Signed)
Lab Results  Component Value Date   [id number] 1,646 (H) 12/28/2022   Reviewed lab results. She has been taking B12 1000 mcg daily Energy level fair  Change B12 1,000 mcg to 5 days/ wk

## 2023-02-22 ENCOUNTER — Ambulatory Visit: Payer: Medicare HMO | Admitting: Bariatrics

## 2023-02-22 ENCOUNTER — Encounter: Payer: Self-pay | Admitting: Bariatrics

## 2023-02-22 VITALS — BP 153/72 | HR 60 | Temp 97.4°F | Ht 68.0 in | Wt 236.0 lb

## 2023-02-22 DIAGNOSIS — R7303 Prediabetes: Secondary | ICD-10-CM

## 2023-02-22 DIAGNOSIS — E669 Obesity, unspecified: Secondary | ICD-10-CM | POA: Diagnosis not present

## 2023-02-22 DIAGNOSIS — Z6835 Body mass index (BMI) 35.0-35.9, adult: Secondary | ICD-10-CM | POA: Diagnosis not present

## 2023-02-22 DIAGNOSIS — N1831 Chronic kidney disease, stage 3a: Secondary | ICD-10-CM

## 2023-02-22 NOTE — Progress Notes (Signed)
WEIGHT SUMMARY AND BIOMETRICS  Weight Lost Since Last Visit: 3lb  Weight Gained Since Last Visit: 0   Vitals Temp: (!) 97.4 F (36.3 C) BP: (!) 153/72 Pulse Rate: 60 SpO2: 99 %   Anthropometric Measurements Height: 5\' 8"  (1.727 m) Weight: 236 lb (107 kg) BMI (Calculated): 35.89 Weight at Last Visit: 239lb Weight Lost Since Last Visit: 3lb Weight Gained Since Last Visit: 0 Starting Weight: 244lb Total Weight Loss (lbs): 8 lb (3.629 kg)   Body Composition  Body Fat %: 47.2 % Fat Mass (lbs): 111.4 lbs Muscle Mass (lbs): 118.4 lbs Total Body Water (lbs): 85 lbs Visceral Fat Rating : 15   Other Clinical Data Fasting: no Labs: no Today's Visit #: 3 Starting Date: 12/28/22    OBESITY Jillian Murray is here to discuss her progress with her obesity treatment plan along with follow-up of her obesity related diagnoses.    Nutrition Plan: the Category 2 plan - 0% adherence.  Current exercise: walking  Interim History:  She is down another 3 lbs since her last visit. She just came back from a trip. She has been walking more.  Eating all of the food on the plan. and Is exceeding snack calorie allotment She is back on track.    PHunger is poorly controlled.  Cravings are moderately controlled.  Assessment/Plan:   Prediabetes Last A1c was 6.2  Medication(s): none  Lab Results  Component Value Date   HGBA1C 6.2 (H) 12/28/2022   Lab Results  Component Value Date   INSULIN 41.7 (H) 12/28/2022    Plan: Will use the app ChatGPT to look for snacks and low calorie meals.  Will minimize all refined carbohydrates both sweets and starches.  Will work on the plan and exercise.  Consider both aerobic and resistance training.  Will keep protein, water, and fiber intake high.  Increase Polyunsaturated and Monounsaturated fats to increase satiety and encourage  weight loss.  Aim for 7 to 9 hours of sleep nightly.  Will continue medications.   Chronic kidney disease, stage G3a/A1  She has a history of HTN. She is taking Benicar. She is taking Lasix.  Plan: She will check her blood pressure at home. Discussed the importance of weight loss and keeping her blood pressure low.     Generalized Obesity: Current BMI BMI (Calculated): 35.89    Jillian Murray is currently in the action stage of change. As such, her goal is to continue with weight loss efforts.  She has agreed to the Category 2 plan.  Exercise goals: Older adults should determine their level of effort for physical activity relative to their level of fitness.   Behavioral modification strategies: increasing lean protein intake, no meal skipping, meal planning , planning for success, avoiding temptations, keep healthy foods in the home, and mindful eating.  Jillian Murray has agreed to follow-up with our clinic in 4 weeks.  Objective:   VITALS: Per patient if applicable, see vitals. GENERAL: Alert and in no acute distress. CARDIOPULMONARY: No increased WOB. Speaking in clear sentences.  PSYCH: Pleasant and cooperative. Speech normal rate and rhythm. Affect is appropriate. Insight and judgement are appropriate. Attention is focused, linear, and appropriate.  NEURO: Oriented as arrived to appointment on time with no prompting.   Attestation Statements:   Obesity Behavioral Intervention: Approximately 15 minutes were spent on the discussion below. ASK: We discussed the diagnosis of obesity with patient today and they agreed to give [place] permission to discuss obesity behavioral modification therapy. ASSESS: Patient has the diagnosis of obesity and BMI today is 35. Patient is in the action stage of change. ADVISE: The patient was educated on the multiple health risks of obesity as well as the benefit of weight loss to improve health. Patient was advised of the need for long term treatment and the  importance of lifestyle modifications to improve current health and to decrease risk of future health problems. AGREE: Multiple dietary modification options and treatment options were discussed, and the patient agreed to follow the recommendations documented in the above note. ARRANGE: The patient was educated on the importance of frequent visits to treat obesity as outlined per OMA, CMS, and USPSTF guidelines and agreed to schedule the next follow up appointment today  Time spent on visit including pre-visit chart review and post-visit charting and care was 45 minutes.   This was prepared with the assistance of Engineer, civil (consulting).  Occasional wrong-word or sound-a-like substitutions may have occurred due to the inherent limitations of voice recognition   Corinna Capra, DO

## 2023-02-28 ENCOUNTER — Telehealth (INDEPENDENT_AMBULATORY_CARE_PROVIDER_SITE_OTHER): Payer: Medicare HMO | Admitting: Psychology

## 2023-03-15 ENCOUNTER — Ambulatory Visit: Payer: Medicare HMO | Admitting: Bariatrics

## 2023-03-15 ENCOUNTER — Encounter: Payer: Self-pay | Admitting: Bariatrics

## 2023-03-15 VITALS — BP 142/80 | HR 55 | Temp 98.0°F | Ht 68.0 in | Wt 234.0 lb

## 2023-03-15 DIAGNOSIS — Z6835 Body mass index (BMI) 35.0-35.9, adult: Secondary | ICD-10-CM

## 2023-03-15 DIAGNOSIS — R7303 Prediabetes: Secondary | ICD-10-CM

## 2023-03-15 DIAGNOSIS — E669 Obesity, unspecified: Secondary | ICD-10-CM | POA: Diagnosis not present

## 2023-03-15 NOTE — Progress Notes (Signed)
WEIGHT SUMMARY AND BIOMETRICS  Weight Lost Since Last Visit: 2lb  Weight Gained Since Last Visit: 0   Vitals Temp: 98 F (36.7 C) BP: (!) 142/80 Pulse Rate: (!) 55 SpO2: 99 %   Anthropometric Measurements Height: 5\' 8"  (1.727 m) Weight: 234 lb (106.1 kg) BMI (Calculated): 35.59 Weight at Last Visit: 236lb Weight Lost Since Last Visit: 2lb Weight Gained Since Last Visit: 0 Starting Weight: 244lb Total Weight Loss (lbs): 10 lb (4.536 kg)   Body Composition  Body Fat %: 47.2 % Fat Mass (lbs): 110.6 lbs Muscle Mass (lbs): 117.4 lbs Total Body Water (lbs): 84.4 lbs Visceral Fat Rating : 15   Other Clinical Data Fasting: no Labs: no Today's Visit #: 4 Starting Date: 12/28/22    OBESITY Delois is here to discuss her progress with her obesity treatment plan along with follow-up of her obesity related diagnoses.    Nutrition Plan: the Category 2 plan - 50% adherence.  Current exercise: none  Interim History:  She is down 2 lbs since her last visit.  She states that she has been skipping meals.  She states that he has not been getting enough protein or enough water.  She is also eating more chocolate   Hunger is moderately controlled.  Cravings are moderately controlled.  Assessment/Plan:   Prediabetes Last A1c was 6.2  Medication(s): none  Lab Results  Component Value Date   HGBA1C 6.2 (H) 12/28/2022   Lab Results  Component Value Date   INSULIN 41.7 (H) 12/28/2022    Plan: Will minimize all refined carbohydrates both sweets and starches.  Will work on the plan and exercise.  Consider both aerobic and resistance training.  Will keep protein, water, and fiber intake high.  She will decrease her carbohydrates, especially her chocolate Increase Polyunsaturated and Monounsaturated fats to increase satiety and encourage weight loss.     Generalized Obesity: Current BMI BMI (Calculated): 35.59    Dyanne is currently in the action stage of change. As such, her goal is to continue with weight loss efforts.  She has agreed to the Category 2 plan.  Exercise goals: Older adults should determine their level of effort for physical activity relative to their level of fitness.  She plans on having knee surgery in January.   Behavioral modification strategies: increasing lean protein intake, decreasing simple carbohydrates , no meal skipping, decrease eating out, meal planning , increase water intake, better snacking choices, planning for success, decrease junk food, decrease snacking , avoiding temptations, keep healthy foods in the home, weigh protein portions, and mindful eating.  Prakriti has agreed to follow-up with our clinic in 2 weeks as she states that she needs closer follow-up at this time.      Objective:   VITALS: Per patient if applicable, see vitals. GENERAL: Alert and in no acute distress. CARDIOPULMONARY: No increased WOB. Speaking  in clear sentences.  PSYCH: Pleasant and cooperative. Speech normal rate and rhythm. Affect is appropriate. Insight and judgement are appropriate. Attention is focused, linear, and appropriate.  NEURO: Oriented as arrived to appointment on time with no prompting.   Attestation Statements:   This was prepared with the assistance of Engineer, civil (consulting).  Occasional wrong-word or sound-a-like substitutions may have occurred due to the inherent limitations of voice recognition   Corinna Capra, DO

## 2023-03-30 ENCOUNTER — Ambulatory Visit: Payer: Medicare HMO | Admitting: Bariatrics

## 2023-03-30 ENCOUNTER — Encounter: Payer: Self-pay | Admitting: Bariatrics

## 2023-03-30 VITALS — BP 134/72 | HR 49 | Temp 97.3°F | Ht 68.0 in | Wt 234.0 lb

## 2023-03-30 DIAGNOSIS — R7303 Prediabetes: Secondary | ICD-10-CM

## 2023-03-30 DIAGNOSIS — E669 Obesity, unspecified: Secondary | ICD-10-CM | POA: Diagnosis not present

## 2023-03-30 DIAGNOSIS — Z6835 Body mass index (BMI) 35.0-35.9, adult: Secondary | ICD-10-CM | POA: Diagnosis not present

## 2023-03-30 DIAGNOSIS — E66812 Morbid (severe) obesity due to excess calories: Secondary | ICD-10-CM

## 2023-03-30 NOTE — Progress Notes (Signed)
WEIGHT SUMMARY AND BIOMETRICS  Weight Lost Since Last Visit: 4lb  Weight Gained Since Last Visit: 0   Vitals Temp: (!) 97.3 F (36.3 C) BP: 134/72 Pulse Rate: (!) 49 SpO2: 99 %   Anthropometric Measurements Height: 5\' 8"  (1.727 m) Weight: 234 lb (106.1 kg) BMI (Calculated): 35.59 Weight at Last Visit: 234lb Weight Lost Since Last Visit: 4lb Weight Gained Since Last Visit: 0 Starting Weight: 244lb Total Weight Loss (lbs): 14 lb (6.35 kg)   Body Composition  Body Fat %: 46.7 % Fat Mass (lbs): 107.6 lbs Muscle Mass (lbs): 116.4 lbs Total Body Water (lbs): 84.6 lbs Visceral Fat Rating : 15   Other Clinical Data Fasting: no Labs: no Today's Visit #: 5 Starting Date: 12/28/22    OBESITY Jillian Murray is here to discuss her progress with her obesity treatment plan along with follow-up of her obesity related diagnoses.    Nutrition Plan: the Category 2 plan - 95% adherence.  Current exercise: none  Interim History: She is down 4 lbs since her last visit.  She plans on having a knee replacement on January 13.   Eating all of the food on the plan., Protein intake is as prescribed, Is not skipping meals, and Water intake is inadequate.   Hunger is moderately controlled.  Cravings are moderately controlled.  Assessment/Plan:   Prediabetes Last A1c was 6.2  Medication(s): none  Lab Results  Component Value Date   HGBA1C 6.2 (H) 12/28/2022   Lab Results  Component Value Date   INSULIN 41.7 (H) 12/28/2022    Plan: Will minimize all refined carbohydrates both sweets and starches.  Will work on the plan and exercise.  Consider both aerobic and resistance training.  Will keep protein, water, and fiber intake high.  Aim for 7 to 9 hours of sleep nightly.  During her rehabilitation time after her knee replacement she will increase her protein.  She was  given information in regard to extra protein with handouts.   Generalized Obesity: Current BMI BMI (Calculated): 35.59   Jillian Murray is currently in the action stage of change. As such, her goal is to continue with weight loss efforts.  She has agreed to the Category 2 plan.  Exercise goals: Older adults should follow the adult guidelines. When older adults cannot meet the adult guidelines, they should be as physically active as their abilities and conditions will allow.   Behavioral modification strategies: increasing lean protein intake, decreasing simple carbohydrates , no meal skipping, meal planning , increase water intake, better snacking choices, planning for success, avoiding temptations, and weigh protein portions.  Jillian Murray has agreed to follow-up with our clinic in 2 weeks.      Objective:   VITALS: Per patient if applicable, see vitals. GENERAL: Alert and in no acute distress. CARDIOPULMONARY: No increased WOB. Speaking in clear sentences.  PSYCH: Pleasant and cooperative. Speech  normal rate and rhythm. Affect is appropriate. Insight and judgement are appropriate. Attention is focused, linear, and appropriate.  NEURO: Oriented as arrived to appointment on time with no prompting.   Attestation Statements:    This was prepared with the assistance of Engineer, civil (consulting).  Occasional wrong-word or sound-a-like substitutions may have occurred due to the inherent limitations of voice recognition   Corinna Capra, DO

## 2023-04-18 ENCOUNTER — Ambulatory Visit: Payer: Medicare HMO | Admitting: Bariatrics

## 2023-04-19 ENCOUNTER — Encounter: Payer: Self-pay | Admitting: Bariatrics

## 2023-04-19 ENCOUNTER — Ambulatory Visit: Payer: Medicare HMO | Admitting: Bariatrics

## 2023-04-19 VITALS — BP 151/80 | HR 51 | Temp 97.4°F | Ht 68.0 in | Wt 234.0 lb

## 2023-04-19 DIAGNOSIS — R7303 Prediabetes: Secondary | ICD-10-CM

## 2023-04-19 DIAGNOSIS — I10 Essential (primary) hypertension: Secondary | ICD-10-CM

## 2023-04-19 DIAGNOSIS — Z6835 Body mass index (BMI) 35.0-35.9, adult: Secondary | ICD-10-CM | POA: Diagnosis not present

## 2023-04-19 DIAGNOSIS — E669 Obesity, unspecified: Secondary | ICD-10-CM | POA: Diagnosis not present

## 2023-04-19 DIAGNOSIS — E66812 Obesity, class 2: Secondary | ICD-10-CM

## 2023-04-19 DIAGNOSIS — I151 Hypertension secondary to other renal disorders: Secondary | ICD-10-CM

## 2023-04-19 NOTE — Progress Notes (Signed)
 WEIGHT SUMMARY AND BIOMETRICS  Weight Lost Since Last Visit: 6lb  Weight Gained Since Last Visit: 0   Vitals Temp: (!) 97.4 F (36.3 C) BP: (!) 151/80 Pulse Rate: (!) 51 SpO2: 100 %   Anthropometric Measurements Height: 5' 8 (1.727 m) Weight: 234 lb (106.1 kg) BMI (Calculated): 35.59 Weight at Last Visit: 234lb Weight Lost Since Last Visit: 6lb Weight Gained Since Last Visit: 0 Starting Weight: 244lb Total Weight Loss (lbs): 20 lb (9.072 kg)   Body Composition  Body Fat %: 46.1 % Fat Mass (lbs): 105.2 lbs Muscle Mass (lbs): 116.8 lbs Total Body Water (lbs): 80.6 lbs Visceral Fat Rating : 14   Other Clinical Data Fasting: no Labs: no Today's Visit #: 6 Starting Date: 12/28/22    OBESITY Felicitas is here to discuss her progress with her obesity treatment plan along with follow-up of her obesity related diagnoses.    Nutrition Plan: the Category 2 plan - 50% adherence.  Current exercise: none  Interim History:  She is down 6 lbs since her last visit. She is very anxious about her upcoming knee replacement on Monday.  She has been exposed to some negative experiences related to knee replacements.  She is anxious about the procedure and also about postop pain. Not eating all of the food on the plan., Is skipping meals, Not journaling consistently., Not meeting protein goals., and Water intake is adequate.   Pharmacotherapy: Dicy is on no pharmacotherapy for weight loss.  Hunger is moderately controlled.  Cravings are poorly controlled.    Assessment/Plan:   Prediabetes Last A1c was 6.2  Medication(s): none Lab Results  Component Value Date   HGBA1C 6.2 (H) 12/28/2022   Lab Results  Component Value Date   INSULIN  41.7 (H) 12/28/2022    Plan: Will minimize all refined carbohydrates both sweets and starches.  Will work on the plan and  exercise.  Consider both aerobic and resistance training.  Will keep protein, water, and fiber intake high.  Aim for 7 to 9 hours of sleep nightly.  Will continue medications.  We discussed keeping her protein intake very high up to 100 to 120 g of protein during her postop period.  Handouts with protein choices were given.  Also ask her to limit her carbohydrates to decrease inflammation.  She will keep her fluid levels high.   Hypertension Hypertension control uncertain.  She is anxious today about upcoming surgery which may account for her elevation in blood pressure today.  She has been taking her blood pressure medications as directed. Medication(s): Hydrochlorothiazide 25 mg daily  and Benicar 5 mg.  BP Readings from Last 3 Encounters:  04/19/23 (!) 151/80  03/30/23 134/72  03/15/23 (!) 142/80   Lab Results  Component Value Date   CREATININE 1.21 (H) 12/28/2022    Plan: Continue all antihypertensives at current dosages. No added salt.  Will keep sodium content to 1,500 mg or less per day.     Generalized Obesity: Current BMI BMI (Calculated): 35.59    Vanessia is currently in the action stage of change. As such, her goal is to continue with weight loss efforts.  She has agreed to the Category 2 plan.  Exercise goals: No exercise has been prescribed at this time. She is having knee surgery on Monday, and will f/u per her surgeon.   Behavioral modification strategies: increasing lean protein intake, decreasing simple carbohydrates , meal planning , better snacking choices, planning for success, avoiding temptations, keep healthy foods in the home, and mindful eating.  Motivational interviewing techniques to identify ways to cope with her anxiety regarding surgery.   Claramae has agreed to follow-up with our clinic in 4 weeks.     Objective:   VITALS: Per patient if applicable, see vitals. GENERAL: Alert and in no acute distress. CARDIOPULMONARY: No increased WOB. Speaking  in clear sentences.  PSYCH: Pleasant and cooperative. Speech normal rate and rhythm. Affect is appropriate. Insight and judgement are appropriate. Attention is focused, linear, and appropriate.  NEURO: Oriented as arrived to appointment on time with no prompting.   Attestation Statements:   Time spent on visit including the items listed below was 30 minutes.  -preparing to see the patient (e.g., review of tests, history, previous notes) -obtaining and/or reviewing separately obtained history -counseling and educating the patient/family/caregiver -documenting clinical information in the electronic or other health record -ordering medications, tests, or procedures -independently interpreting results and communicating results to the patient/ family/caregiver -referring and communicating with other health care professionals  -care coordination   This was prepared with the assistance of Engineer, Civil (consulting).  Occasional wrong-word or sound-a-like substitutions may have occurred due to the inherent limitations of voice recognition   Clayborne Daring, DO

## 2023-05-12 ENCOUNTER — Encounter: Payer: Self-pay | Admitting: Bariatrics

## 2023-05-12 ENCOUNTER — Ambulatory Visit: Payer: Medicare HMO | Admitting: Bariatrics

## 2023-05-12 VITALS — BP 160/78 | HR 75 | Temp 97.4°F | Ht 68.0 in | Wt 222.0 lb

## 2023-05-12 DIAGNOSIS — E669 Obesity, unspecified: Secondary | ICD-10-CM | POA: Diagnosis not present

## 2023-05-12 DIAGNOSIS — E6609 Other obesity due to excess calories: Secondary | ICD-10-CM

## 2023-05-12 DIAGNOSIS — Z6833 Body mass index (BMI) 33.0-33.9, adult: Secondary | ICD-10-CM

## 2023-05-12 DIAGNOSIS — R7303 Prediabetes: Secondary | ICD-10-CM

## 2023-05-12 NOTE — Progress Notes (Signed)
WEIGHT SUMMARY AND BIOMETRICS  Weight Lost Since Last Visit: 12lb  Weight Gained Since Last Visit: 0   Vitals Temp: (!) 97.4 F (36.3 C) BP: (!) 160/78 Pulse Rate: 75 SpO2: 100 %   Anthropometric Measurements Height: 5\' 8"  (1.727 m) Weight: 222 lb (100.7 kg) BMI (Calculated): 33.76 Weight at Last Visit: 234lb Weight Lost Since Last Visit: 12lb Weight Gained Since Last Visit: 0 Starting Weight: 244lb Total Weight Loss (lbs): 32 lb (14.5 kg)   Body Composition  Body Fat %: 47.5 % Fat Mass (lbs): 105.4 lbs Muscle Mass (lbs): 110.8 lbs Total Body Water (lbs): 78.6 lbs Visceral Fat Rating : 15   Other Clinical Data Fasting: no Labs: no Today's Visit #: 7 Starting Date: 12/28/22    OBESITY Jillian Murray is here to discuss her progress with her obesity treatment plan along with follow-up of her obesity related diagnoses.    Nutrition Plan: the Category 2 plan - 0% adherence.  Current exercise: none  Interim History:  She is down 12 lbs since her last visit.  She had a total right knee replacement on  on April 25 2023.  She states that she is still in considerable pain and has struggled with eating the right foods and the right amounts.  She is doing well with her breakfast, but struggles with her other meals. Not eating all of the food on the plan., Protein intake is as prescribed, Is skipping meals, and Water intake is adequate.   Hunger is poorly controlled.  Cravings are moderately controlled.  Assessment/Plan:   Prediabetes Last A1c was 6.2  Medication(s): none Lab Results  Component Value Date   HGBA1C 6.2 (H) 12/28/2022   Lab Results  Component Value Date   INSULIN 41.7 (H) 12/28/2022    Plan: Will minimize all refined carbohydrates both sweets and starches.  Will work on the plan and exercise.  Consider both aerobic and resistance  training.  Will keep protein, water, and fiber intake high.  Aim for 7 to 9 hours of sleep nightly.  She will resume a better diet that is more focused on the plan.  She 0 or Gatorade 0 to help with leg cramps. She will continue to use her personalize machine for exercise and will continue her PT as prescribed by her surgeon.   Generalized Obesity: Current BMI BMI (Calculated): 33.76    Jillian Murray is currently in the action stage of change. As such, her goal is to continue with weight loss efforts.  She has agreed to the Category 2 plan.  Exercise goals: Older adults should follow the adult guidelines. When older adults cannot meet the adult guidelines, they should be as physically active as their abilities and conditions will allow.  She is using a walker for ambulation.  Behavioral modification strategies: increasing lean protein intake, no meal skipping, increase water intake, better snacking choices,  and planning for success.  Jillian Murray has agreed to follow-up with our clinic in 3 weeks.       Objective:   VITALS: Per patient if applicable, see vitals. GENERAL: Alert and in no acute distress. CARDIOPULMONARY: No increased WOB. Speaking in clear sentences.  PSYCH: Pleasant and cooperative. Speech normal rate and rhythm. Affect is appropriate. Insight and judgement are appropriate. Attention is focused, linear, and appropriate.  NEURO: Oriented as arrived to appointment on time with no prompting.   Attestation Statements:   This was prepared with the assistance of Engineer, civil (consulting).  Occasional wrong-word or sound-a-like substitutions may have occurred due to the inherent limitations of voice recognition   Jillian Murray, Jillian Murray

## 2023-05-23 ENCOUNTER — Ambulatory Visit: Payer: Medicare HMO | Admitting: Bariatrics

## 2023-05-23 ENCOUNTER — Encounter: Payer: Self-pay | Admitting: Bariatrics

## 2023-05-23 VITALS — BP 138/70 | HR 60 | Ht 68.0 in | Wt 223.0 lb

## 2023-05-23 DIAGNOSIS — R632 Polyphagia: Secondary | ICD-10-CM

## 2023-05-23 DIAGNOSIS — E669 Obesity, unspecified: Secondary | ICD-10-CM

## 2023-05-23 DIAGNOSIS — Z6833 Body mass index (BMI) 33.0-33.9, adult: Secondary | ICD-10-CM | POA: Diagnosis not present

## 2023-05-23 DIAGNOSIS — R7303 Prediabetes: Secondary | ICD-10-CM | POA: Diagnosis not present

## 2023-05-23 DIAGNOSIS — E6609 Other obesity due to excess calories: Secondary | ICD-10-CM

## 2023-05-23 NOTE — Progress Notes (Signed)
WEIGHT SUMMARY AND BIOMETRICS  Weight Lost Since Last Visit: 0  Weight Gained Since Last Visit: 1lb   Vitals BP: 138/70 Pulse Rate: 60 SpO2: 100 %   Anthropometric Measurements Height: 5\' 8"  (1.727 m) Weight: 223 lb (101.2 kg) BMI (Calculated): 33.91 Weight at Last Visit: 222lb Weight Lost Since Last Visit: 0 Weight Gained Since Last Visit: 1lb Starting Weight: 244lb Total Weight Loss (lbs): 31 lb (14.1 kg)   Body Composition  Body Fat %: 49 % Fat Mass (lbs): 109.6 lbs Muscle Mass (lbs): 108.2 lbs Total Body Water (lbs): 82.4 lbs Visceral Fat Rating : 15   Other Clinical Data Fasting: no Labs: no Today's Visit #: 8 Starting Date: 12/28/22    OBESITY Jillian Murray is here to discuss her progress with her obesity treatment plan along with follow-up of her obesity related diagnoses.    Nutrition Plan: the Category 2 plan - 10% adherence.  Current exercise: none  Interim History:  She is up 1 lb since her last visit, but according to the bio-impedence scale she is up almost 4 lbs of water.  She is still recovering from orthopedic surgery and is using a walker but will be switching to a cane in the near future.  She said she has still done more stress eating secondary to the pain but her pain is starting to lessen.  Eating all of the food on the plan., Protein intake is as prescribed, Is not skipping meals, and Water intake is adequate.   Hunger is moderately controlled.  Cravings are poorly controlled.  Assessment/Plan:   Prediabetes Last A1c was 6.2  Medication(s): none  Lab Results  Component Value Date   HGBA1C 6.2 (H) 12/28/2022   Lab Results  Component Value Date   INSULIN 41.7 (H) 12/28/2022    Plan: Will minimize all refined carbohydrates both sweets and starches.  Will work on the plan and exercise.  Consider both aerobic and  resistance training.  Will keep protein, water, and fiber intake high.  Increase Polyunsaturated and Monounsaturated fats to increase satiety and encourage weight loss.  Aim for 7 to 9 hours of sleep nightly.  She will set realistic goals and will accept the reality of her weight. Discussed how to get a more positive attitude toward losing weight.  She will get out of the house more.  She will focus on sleeping through the night.    Polyphagia Jillian Murray endorses excessive hunger.  Medication(s): none Appetite:  moderately controlled. Cravings are poorly controlled.   Plan: Medication(s): none Will increase water, protein and fiber to help assuage hunger.  Will minimize foods that have a high glucose index/load to minimize reactive hypoglycemia.  Healthy recipes given.     Generalized Obesity: Current BMI BMI (Calculated): 33.91    Jillian Murray is currently in the action stage of change. As such, her goal is to continue  with weight loss efforts.  She has agreed to the Category 2 plan.  Exercise goals: Older adults should determine their level of effort for physical activity relative to their level of fitness.  She will continue her PT as directed per her orthopedic surgeon and will continue to do PT exercises at home as appropriate.  Behavioral modification strategies: increasing lean protein intake, meal planning , increase water intake, better snacking choices, planning for success, decrease junk food, ways to avoid boredom eating, and keep healthy foods in the home.  Jillian Murray has agreed to follow-up with our clinic in 2 weeks.      Objective:   VITALS: Per patient if applicable, see vitals. GENERAL: Alert and in no acute distress. CARDIOPULMONARY: No increased WOB. Speaking in clear sentences.  PSYCH: Pleasant and cooperative. Speech normal rate and rhythm. Affect is appropriate. Insight and judgement are appropriate. Attention is focused, linear, and appropriate.  NEURO: Oriented as  arrived to appointment on time with no prompting.   Attestation Statements:   Time spent on visit including the items listed below was 35 minutes.  -preparing to see the patient (e.g., review of tests, history, previous notes) -obtaining and/or reviewing separately obtained history -counseling and educating the patient/family/caregiver -documenting clinical information in the electronic or other health record -ordering medications, tests, or procedures -independently interpreting results and communicating results to the patient/ family/caregiver -referring and communicating with other health care professionals  -care coordination   This was prepared with the assistance of Engineer, civil (consulting).  Occasional wrong-word or sound-a-like substitutions may have occurred due to the inherent limitations of voice recognition

## 2023-06-06 ENCOUNTER — Ambulatory Visit: Payer: Medicare HMO | Admitting: Bariatrics

## 2023-06-06 ENCOUNTER — Encounter: Payer: Self-pay | Admitting: Bariatrics

## 2023-06-06 VITALS — BP 138/76 | HR 73 | Temp 97.6°F | Ht 68.0 in | Wt 222.0 lb

## 2023-06-06 DIAGNOSIS — Z6833 Body mass index (BMI) 33.0-33.9, adult: Secondary | ICD-10-CM

## 2023-06-06 DIAGNOSIS — R632 Polyphagia: Secondary | ICD-10-CM | POA: Diagnosis not present

## 2023-06-06 DIAGNOSIS — E6609 Other obesity due to excess calories: Secondary | ICD-10-CM

## 2023-06-06 DIAGNOSIS — E669 Obesity, unspecified: Secondary | ICD-10-CM | POA: Diagnosis not present

## 2023-06-06 DIAGNOSIS — F5089 Other specified eating disorder: Secondary | ICD-10-CM

## 2023-06-06 MED ORDER — TOPIRAMATE 50 MG PO TABS: 50.0000 mg | ORAL_TABLET | Freq: Two times a day (BID) | ORAL | 0 refills | Status: DC

## 2023-06-06 NOTE — Progress Notes (Unsigned)
 WEIGHT SUMMARY AND BIOMETRICS  Weight Lost Since Last Visit: 1lb  Weight Gained Since Last Visit: 0lb   Vitals Temp: 97.6 F (36.4 C) BP: 138/76 Pulse Rate: 73 SpO2: 100 %   Anthropometric Measurements Height: 5\' 8"  (1.727 m) Weight: 222 lb (100.7 kg) BMI (Calculated): 33.76 Weight at Last Visit: 223lb Weight Lost Since Last Visit: 1lb Weight Gained Since Last Visit: 0lb Starting Weight: 244lb Total Weight Loss (lbs): 22 lb (9.979 kg)   Body Composition  Body Fat %: 48 % Fat Mass (lbs): 106.8 lbs Muscle Mass (lbs): 110 lbs Total Body Water (lbs): 83.8 lbs Visceral Fat Rating : 15   Other Clinical Data Fasting: No Labs: No Today's Visit #: 9 Starting Date: 12/28/22    OBESITY Korissa is here to discuss her progress with her obesity treatment plan along with follow-up of her obesity related diagnoses.    Nutrition Plan: the Category 2 plan - 30% adherence.  Current exercise: none  Interim History:  She is down 1 lb since her last visit.  Protein intake is less than prescribed., Is exceeding snack calorie allotment, Not journaling consistently., Not meeting calorie goals., and Reports polyphagia   Hunger is moderately controlled.  Cravings are poorly controlled.  Assessment/Plan:  Jubilee Vivero endorses excessive hunger.  Medication(s): none Hunger:  moderately controlled. Cravings are poorly controlled.   Plan: Will increase water, protein and fiber to help assuage hunger.  Will minimize foods that have a high glucose index/load to minimize reactive hypoglycemia.  Will have healthy foods at home.   Eating disorder/emotional eating Yasmen has had issues with stress eating, emotional eating, nighttime eating, and boredom eating. Currently this is poorly controlled. Overall mood is stable. Denies suicidal/homicidal  ideation. Medication(s): none  Plan:  Motivational interviewing as well as evidence-based interventions for health behavior change were utilized today including the discussion of self monitoring techniques, problem-solving barriers and SMART goal setting techniques.  Rx Topamax 1 twice daily #60 with no refills Discussed distractions to curb eating behaviors. Discussed activities to do with one's hands in the evening  Be sure to get adequate rest as lack of rest can trigger appetite.  Have plan in place for stressful events.  Consider other rewards besides food.      Generalized Obesity: Current BMI BMI (Calculated): 33.76   Suhani is currently in the action stage of change. As such, her goal is to continue with weight loss efforts.  She has agreed to the Category 2 plan.  Exercise goals: Older adults with chronic conditions should understand whether and how their conditions affect their ability to do regular physical activity safely.  She is now walking with a cane when she is out of the house and is walking independently at home.  She is also trying to do some stairs with her husband's assistance  Behavioral modification strategies: increasing lean protein  intake, decreasing simple carbohydrates , no meal skipping, meal planning , better snacking choices, planning for success, emotional eating strategies, decrease junk food, get rid of junk food in the home, and decrease snacking .  Laterria has agreed to follow-up with our clinic in 2 weeks.      Objective:   VITALS: Per patient if applicable, see vitals. GENERAL: Alert and in no acute distress. CARDIOPULMONARY: No increased WOB. Speaking in clear sentences.  PSYCH: Pleasant and cooperative. Speech normal rate and rhythm. Affect is appropriate. Insight and judgement are appropriate. Attention is focused, linear, and appropriate.  NEURO: Oriented as arrived to appointment on time with no prompting.   Attestation Statements:     This was prepared with the assistance of Engineer, civil (consulting).  Occasional wrong-word or sound-a-like substitutions may have occurred due to the inherent limitations of voice recognition   Corinna Capra, DO

## 2023-06-20 ENCOUNTER — Encounter: Payer: Self-pay | Admitting: Bariatrics

## 2023-06-20 ENCOUNTER — Ambulatory Visit: Payer: Medicare HMO | Admitting: Bariatrics

## 2023-06-20 VITALS — BP 155/84 | HR 60 | Temp 97.6°F | Ht 68.0 in | Wt 222.0 lb

## 2023-06-20 DIAGNOSIS — E66811 Obesity, class 1: Secondary | ICD-10-CM

## 2023-06-20 DIAGNOSIS — E669 Obesity, unspecified: Secondary | ICD-10-CM | POA: Diagnosis not present

## 2023-06-20 DIAGNOSIS — R7303 Prediabetes: Secondary | ICD-10-CM | POA: Diagnosis not present

## 2023-06-20 DIAGNOSIS — F5089 Other specified eating disorder: Secondary | ICD-10-CM

## 2023-06-20 DIAGNOSIS — Z6833 Body mass index (BMI) 33.0-33.9, adult: Secondary | ICD-10-CM | POA: Diagnosis not present

## 2023-06-20 NOTE — Progress Notes (Signed)
 WEIGHT SUMMARY AND BIOMETRICS  Weight Lost Since Last Visit: 0lb  Weight Gained Since Last Visit: 0lb   Vitals Temp: 97.6 F (36.4 C) BP: (!) 155/84 Pulse Rate: 60 SpO2: 100 %   Anthropometric Measurements Height: 5\' 8"  (1.727 m) Weight: 222 lb (100.7 kg) BMI (Calculated): 33.76 Weight at Last Visit: 222lb Weight Lost Since Last Visit: 0lb Weight Gained Since Last Visit: 0lb Starting Weight: 244 Total Weight Loss (lbs): 22 lb (9.979 kg)   Body Composition  Body Fat %: 47.6 % Fat Mass (lbs): 105.6 lbs Muscle Mass (lbs): 110.6 lbs Total Body Water (lbs): 85.6 lbs Visceral Fat Rating : 15   Other Clinical Data Fasting: NO Labs: No Today's Visit #: 10 Starting Date: 12/28/22    OBESITY Kiora is here to discuss her progress with her obesity treatment plan along with follow-up of her obesity related diagnoses.    Nutrition Plan: the Category 2 plan - 50% adherence.  Current exercise: none  Interim History:  Her weight remains the same.  Eating all of the food on the plan., Protein intake is less than prescribed., Is skipping meals, and Water intake is inadequate.   Pharmacotherapy: Wally is on None Adverse side effects: N/A Hunger is moderately controlled.  Cravings are poorly controlled. She is controlled at times.  Assessment/Plan:   Eating disorder/emotional eating Ilianna has had issues with stress eating, emotional eating, and boredom eating. Currently this is poorly controlled. Overall mood is stable. Denies suicidal/homicidal ideation. Medication(s): Topamax 50 mg BID  Plan:  Motivational interviewing as well as evidence-based interventions for health behavior change were utilized today including the discussion of self monitoring techniques, problem-solving barriers and SMART goal setting techniques.  Consider a referral to Dr. Dewaine Conger,  PhD psychologist to help with eating behaviors.  Discussed distractions to curb eating behaviors. Discussed activities to do with one's hands in the evening  Be sure to get adequate rest as lack of rest can trigger appetite.  Have plan in place for stressful events.  Consider other rewards besides food.    Prediabetes Last A1c was 6.2  Medication(s): none Lab Results  Component Value Date   HGBA1C 6.2 (H) 12/28/2022   Lab Results  Component Value Date   INSULIN 41.7 (H) 12/28/2022    Plan:  Will minimize all refined carbohydrates both sweets and starches.  Will work on the plan and exercise.  Consider both aerobic and resistance training.  Will keep protein, water, and fiber intake high.  Increase Polyunsaturated and Monounsaturated fats to increase satiety and encourage weight loss.  She will set 1 day a week where she can have a "cheat day"   Generalized Obesity: Current BMI BMI (Calculated): 33.76    Chaz is currently in the action stage of change. As such, her goal is to continue with weight loss efforts.  She has agreed to the  Category 2 plan.  Exercise goals: Older adults should follow the adult guidelines. When older adults cannot meet the adult guidelines, they should be as physically active as their abilities and conditions will allow.   Behavioral modification strategies: increasing lean protein intake, decreasing simple carbohydrates , no meal skipping, meal planning , increase water intake, better snacking choices, planning for success, increasing vegetables, keep healthy foods in the home, and mindful eating.  Tieshia has agreed to follow-up with our clinic in 2 weeks.       Objective:   VITALS: Per patient if applicable, see vitals. GENERAL: Alert and in no acute distress. CARDIOPULMONARY: No increased WOB. Speaking in clear sentences.  PSYCH: Pleasant and cooperative. Speech normal rate and rhythm. Affect is appropriate. Insight and judgement are  appropriate. Attention is focused, linear, and appropriate.  NEURO: Oriented as arrived to appointment on time with no prompting.   Attestation Statements:   This was prepared with the assistance of Engineer, civil (consulting).  Occasional wrong-word or sound-a-like substitutions may have occurred due to the inherent limitations of voice recognition   Corinna Capra, DO

## 2023-07-05 ENCOUNTER — Ambulatory Visit: Admitting: Bariatrics

## 2023-07-05 ENCOUNTER — Encounter: Payer: Self-pay | Admitting: Bariatrics

## 2023-07-05 VITALS — BP 128/76 | HR 69 | Temp 97.8°F | Ht 68.0 in | Wt 217.0 lb

## 2023-07-05 DIAGNOSIS — Z6833 Body mass index (BMI) 33.0-33.9, adult: Secondary | ICD-10-CM

## 2023-07-05 DIAGNOSIS — R7303 Prediabetes: Secondary | ICD-10-CM

## 2023-07-05 DIAGNOSIS — E66811 Obesity, class 1: Secondary | ICD-10-CM

## 2023-07-05 DIAGNOSIS — E6609 Other obesity due to excess calories: Secondary | ICD-10-CM

## 2023-07-05 NOTE — Progress Notes (Signed)
 WEIGHT SUMMARY AND BIOMETRICS  Weight Lost Since Last Visit: 5lb  Weight Gained Since Last Visit: 0   Vitals Temp: 97.8 F (36.6 C) BP: 128/76 Pulse Rate: 69 SpO2: 98 %   Anthropometric Measurements Height: 5\' 8"  (1.727 m) Weight: 217 lb (98.4 kg) BMI (Calculated): 33 Weight at Last Visit: 222lb Weight Lost Since Last Visit: 5lb Weight Gained Since Last Visit: 0 Starting Weight: 244lb Total Weight Loss (lbs): 27 lb (12.2 kg)   Body Composition  Body Fat %: 46.7 % Fat Mass (lbs): 101.4 lbs Muscle Mass (lbs): 110 lbs Total Body Water (lbs): 81.8 lbs Visceral Fat Rating : 14   Other Clinical Data Fasting: no Labs: no Today's Visit #: 11 Starting Date: 12/28/22    OBESITY Jillian Murray is here to discuss her progress with her obesity treatment plan along with follow-up of her obesity related diagnoses.    Nutrition Plan: the Category 2 plan - 50% adherence.  Current exercise:  physical therapy.  Interim History:  She is down 5 lbs since her last visit. She had been doing well, but had some stress and ate chocolate, but then got back on track.  Eating all of the food on the plan., Protein intake is as prescribed, Is exceeding snack calorie allotment, and Water intake is adequate.   Hunger is moderately controlled.  Cravings are moderately controlled.  Assessment/Plan:   Prediabetes Last A1c was 6.2   Lab Results  Component Value Date   HGBA1C 6.2 (H) 12/28/2022   Lab Results  Component Value Date   INSULIN 41.7 (H) 12/28/2022    Plan: Will minimize all refined carbohydrates both sweets and starches.  Will work on the plan and exercise.  Consider both aerobic and resistance training.  Will keep protein, water, and fiber intake high.  Increase Polyunsaturated and Monounsaturated fats to increase satiety and encourage weight loss.  Aim for 7  to 9 hours of sleep nightly.  She will continue to do her PT exercises at home and will exercise at the gym when she is on vacation. She will also do more walking over time. She will cook more of her meals at home    Generalized Obesity: Current BMI BMI (Calculated): 33    Allisa is currently in the action stage of change. As such, her goal is to continue with weight loss efforts.  She has agreed to the Category 2 plan.  Exercise goals: Older adults should follow the adult guidelines. When older adults cannot meet the adult guidelines, they should be as physically active as their abilities and conditions will allow.  Older adults should determine their level of effort for physical activity relative to their level of fitness.   Behavioral modification strategies: increasing lean protein intake, decreasing simple carbohydrates , no meal skipping, decrease eating out, meal planning , better snacking choices, planning for success, get rid  of junk food in the home, avoiding temptations, keep healthy foods in the home, travel eating strategies, and mindful eating.  Burkley has agreed to follow-up with our clinic in 4 weeks.    Objective:   VITALS: Per patient if applicable, see vitals. GENERAL: Alert and in no acute distress. CARDIOPULMONARY: No increased WOB. Speaking in clear sentences.  PSYCH: Pleasant and cooperative. Speech normal rate and rhythm. Affect is appropriate. Insight and judgement are appropriate. Attention is focused, linear, and appropriate.  NEURO: Oriented as arrived to appointment on time with no prompting.   Attestation Statements:   This was prepared with the assistance of Engineer, civil (consulting).  Occasional wrong-word or sound-a-like substitutions may have occurred due to the inherent limitations of voice recognition   Jillian Capra, DO

## 2023-07-07 ENCOUNTER — Ambulatory Visit: Admitting: Bariatrics

## 2023-08-04 ENCOUNTER — Ambulatory Visit: Admitting: Bariatrics

## 2023-08-04 ENCOUNTER — Encounter: Payer: Self-pay | Admitting: Bariatrics

## 2023-08-04 VITALS — BP 134/86 | HR 63 | Temp 98.1°F | Ht 68.0 in | Wt 223.0 lb

## 2023-08-04 DIAGNOSIS — R7303 Prediabetes: Secondary | ICD-10-CM

## 2023-08-04 DIAGNOSIS — E66811 Obesity, class 1: Secondary | ICD-10-CM

## 2023-08-04 DIAGNOSIS — Z6833 Body mass index (BMI) 33.0-33.9, adult: Secondary | ICD-10-CM

## 2023-08-04 DIAGNOSIS — E669 Obesity, unspecified: Secondary | ICD-10-CM

## 2023-08-04 NOTE — Progress Notes (Signed)
 WEIGHT SUMMARY AND BIOMETRICS  Weight Lost Since Last Visit: 0lb  Weight Gained Since Last Visit: 6lb   Vitals Temp: 98.1 F (36.7 C) BP: 134/86 Pulse Rate: 63 SpO2: 99 %   Anthropometric Measurements Height: 5\' 8"  (1.727 m) Weight: 223 lb (101.2 kg) BMI (Calculated): 33.91 Weight at Last Visit: 217lb Weight Lost Since Last Visit: 0lb Weight Gained Since Last Visit: 6lb Starting Weight: 244lb Total Weight Loss (lbs): 21 lb (9.526 kg)   Body Composition  Body Fat %: 48.2 % Fat Mass (lbs): 107.8 lbs Muscle Mass (lbs): 110 lbs Total Body Water (lbs): 89 lbs Visceral Fat Rating : 15   Other Clinical Data Fasting: No Labs: No Today's Visit #: 12 Starting Date: 12/28/22    OBESITY Jillian Murray is here to discuss her progress with her obesity treatment plan along with follow-up of her obesity related diagnoses.    Nutrition Plan: the Category 2 plan - 10% adherence.  Current exercise: none  Interim History:  She is up 6 pounds, but the bioimpedance scale breakdown shows that she is up over 7 pounds of water which is most likely related to her recent episode of pain related to her recent surgery.  She has swelling of her knee and has been elevating her leg and knee and also using ice.  She saw her surgeon yesterday and is following the surgeons plan. Eating all of the food on the plan., Protein intake is as prescribed, Is not skipping meals, Meeting calorie goals., and Water intake is adequate.   Hunger is moderately controlled.  Cravings are moderately controlled.  Assessment/Plan:   Prediabetes Last A1c was 6.2  Medication(s): none  Lab Results  Component Value Date   HGBA1C 6.2 (H) 12/28/2022   Lab Results  Component Value Date   INSULIN  41.7 (H) 12/28/2022    Plan: She will plan her meals Will minimize all refined carbohydrates both sweets  and starches.  She will gradually increase her exercise over time She will reduce this temptations for candy and every sweets. Will keep protein, water, and fiber intake high.  Aim for 7 to 9 hours of sleep nightly.      Generalized Obesity: Current BMI BMI (Calculated): 33.91    Jillian Murray is currently in the action stage of change. As such, her goal is to continue with weight loss efforts.  She has agreed to the Category 2 plan.  Exercise goals: She will gradually increase her exercise over time per her surgeons recommendation and then will go to more resistance and aerobic exercise.   Behavioral modification strategies: increasing lean protein intake, decreasing simple carbohydrates , no meal skipping, meal planning , increase water intake, better snacking choices, planning for success, increasing vegetables, avoiding temptations, and mindful eating.  Jillian Murray has agreed to follow-up with our clinic in 2 weeks.     Objective:   VITALS:  Per patient if applicable, see vitals. GENERAL: Alert and in no acute distress. CARDIOPULMONARY: No increased WOB. Speaking in clear sentences.  PSYCH: Pleasant and cooperative. Speech normal rate and rhythm. Affect is appropriate. Insight and judgement are appropriate. Attention is focused, linear, and appropriate.  NEURO: Oriented as arrived to appointment on time with no prompting.   Attestation Statements:   This was prepared with the assistance of Engineer, civil (consulting).  Occasional wrong-word or sound-a-like substitutions may have occurred due to the inherent limitations of voice recognition   Kirk Peper, DO

## 2023-08-18 ENCOUNTER — Encounter: Payer: Self-pay | Admitting: Bariatrics

## 2023-08-18 ENCOUNTER — Ambulatory Visit: Admitting: Bariatrics

## 2023-08-18 VITALS — BP 134/73 | HR 57 | Ht 68.0 in | Wt 219.0 lb

## 2023-08-18 DIAGNOSIS — Z6833 Body mass index (BMI) 33.0-33.9, adult: Secondary | ICD-10-CM

## 2023-08-18 DIAGNOSIS — R632 Polyphagia: Secondary | ICD-10-CM | POA: Diagnosis not present

## 2023-08-18 DIAGNOSIS — E669 Obesity, unspecified: Secondary | ICD-10-CM | POA: Diagnosis not present

## 2023-08-18 DIAGNOSIS — E6609 Other obesity due to excess calories: Secondary | ICD-10-CM

## 2023-08-18 NOTE — Progress Notes (Addendum)
 WEIGHT SUMMARY AND BIOMETRICS  Weight Lost Since Last Visit: 4lb  Weight Gained Since Last Visit: 0   Vitals BP: 134/73 Pulse Rate: (!) 57 SpO2: 100 %   Anthropometric Measurements Height: 5\' 8"  (1.727 m) Weight: 219 lb (99.3 kg) BMI (Calculated): 33.31 Weight at Last Visit: 223lb Weight Lost Since Last Visit: 4lb Weight Gained Since Last Visit: 0 Starting Weight: 244lb Total Weight Loss (lbs): 25 lb (11.3 kg)   Body Composition  Body Fat %: 45.7 % Fat Mass (lbs): 100.2 lbs Muscle Mass (lbs): 113 lbs Total Body Water (lbs): 81.2 lbs Visceral Fat Rating : 14   Other Clinical Data Fasting: no Labs: no Today's Visit #: 13 Starting Date: 12/28/22    OBESITY Jillian Murray is here to discuss her progress with her obesity treatment plan along with follow-up of her obesity related diagnoses.    Nutrition Plan: the Category 2 plan - 60% adherence.  Current exercise: goes to the gym for exercise.  Interim History:  She is down 4 lbs since her last visit.  Eating all of the food on the plan., Protein intake is as prescribed, Is not skipping meals, Not journaling consistently., and Water intake is adequate.   Hunger is moderately controlled.  Cravings are moderately controlled.  Assessment/Plan:   Jillian Murray endorses excessive hunger.  Medication(s): none Effects of medication:  moderately controlled. Cravings are moderately controlled.  She states that her kidney function is decreased and she is seeing a nephrologist I looked at her labs and her GFR at her last visit with the nephrologist was 32, BUN was 47, and her creatinine was elevated  Plan:  Will increase water, protein and fiber to help assuage hunger.  Will minimize foods that have a high glucose index/load to minimize reactive hypoglycemia.  Will continue her chair yoga and increase  flexibility. Talked about the importance of instilling new habits.  Discussed plant-based protein and protein paring. She will increase her water intake. She will decrease her protein to 70 g and make it plant-based.  Information sheets were given in regard to plant-based proteins.     Generalized Obesity: Current BMI BMI (Calculated): 33.31    Jillian Murray is currently in the action stage of change. As such, her goal is to continue with weight loss efforts.  She has agreed to the Category 2 plan.  Exercise goals: Older adults should determine their level of effort for physical activity relative to their level of fitness.   Behavioral modification strategies: increasing lean protein intake, no meal skipping, meal planning , increase water intake, better snacking choices, planning for success, increasing vegetables, avoiding temptations, and mindful eating.  Jillian Murray has agreed to follow-up with our clinic in 2 weeks.      Objective:   VITALS: Per patient if applicable, see vitals. GENERAL: Alert and in no acute distress. CARDIOPULMONARY: No increased WOB. Speaking  in clear sentences.  PSYCH: Pleasant and cooperative. Speech normal rate and rhythm. Affect is appropriate. Insight and judgement are appropriate. Attention is focused, linear, and appropriate.  NEURO: Oriented as arrived to appointment on time with no prompting.   Attestation Statements:    Time spent on visit in care of the patient today including the items listed below was 34 minutes.    20 minutes were spent talking about the history, 10 minutes for face to face counseling implementing the plan, discussing the specifics of how to arrange meals, meal planning, water intake.    I counseled the patient on the following conditions:low GFR, and strategies for weight loss     Discussed the following information sheets plant-based protein and high-protein vegetables..    I  reviewed the labs which were ordered from her visit  on 08/11/2023.   I additionally spent time documenting, reviewing, and checking the codes before submitting.     This was prepared with the assistance of Engineer, civil (consulting).  Occasional wrong-word or sound-a-like substitutions may have occurred due to the inherent limitations of voice recognition   Kirk Peper, DO

## 2023-09-01 ENCOUNTER — Ambulatory Visit: Admitting: Bariatrics

## 2023-09-29 ENCOUNTER — Ambulatory Visit: Admitting: Bariatrics

## 2023-09-29 ENCOUNTER — Encounter: Payer: Self-pay | Admitting: Bariatrics

## 2023-09-29 VITALS — BP 147/66 | HR 56 | Temp 97.9°F | Ht 68.0 in | Wt 221.0 lb

## 2023-09-29 DIAGNOSIS — Z6833 Body mass index (BMI) 33.0-33.9, adult: Secondary | ICD-10-CM | POA: Diagnosis not present

## 2023-09-29 DIAGNOSIS — F5089 Other specified eating disorder: Secondary | ICD-10-CM

## 2023-09-29 DIAGNOSIS — E661 Drug-induced obesity: Secondary | ICD-10-CM

## 2023-09-29 DIAGNOSIS — E66811 Obesity, class 1: Secondary | ICD-10-CM

## 2023-09-29 DIAGNOSIS — R7303 Prediabetes: Secondary | ICD-10-CM

## 2023-09-29 DIAGNOSIS — E669 Obesity, unspecified: Secondary | ICD-10-CM

## 2023-09-29 DIAGNOSIS — I151 Hypertension secondary to other renal disorders: Secondary | ICD-10-CM

## 2023-09-29 NOTE — Progress Notes (Signed)
 WEIGHT SUMMARY AND BIOMETRICS  Weight Lost Since Last Visit: 0  Weight Gained Since Last Visit: 2lb   Vitals Temp: 97.9 F (36.6 C) BP: (!) 147/66 Pulse Rate: (!) 56 SpO2: 99 %   Anthropometric Measurements Height: 5' 8 (1.727 m) Weight: 221 lb (100.2 kg) BMI (Calculated): 33.61 Weight at Last Visit: 219lb Weight Lost Since Last Visit: 0 Weight Gained Since Last Visit: 2lb Starting Weight: 244lb Total Weight Loss (lbs): 23 lb (10.4 kg)   Body Composition  Body Fat %: 46 % Fat Mass (lbs): 101.8 lbs Muscle Mass (lbs): 113.4 lbs Total Body Water (lbs): 85.8 lbs Visceral Fat Rating : 14   Other Clinical Data Fasting: no Labs: no Today's Visit #: 14 Starting Date: 12/28/22    OBESITY Jillian Murray is here to discuss her progress with her obesity treatment plan along with follow-up of her obesity related diagnoses.    Nutrition Plan: the Category 2 plan - 0% adherence.  Current exercise: none  Interim History:  She is up 2 lbs since her last visit.  Not eating all of the food on the plan., Protein intake is less than prescribed., Is exceeding snack calorie allotment, and Reports polyphagia  Hunger is poorly controlled.  Cravings are moderately controlled.  Assessment/Plan:   Eating disorder/emotional eating Jillian Murray has had issues with stress eating, emotional eating, nighttime eating, and boredom eating. Currently this is moderately controlled. Overall mood is stable. Denies suicidal/homicidal ideation. Medication(s): We tried a trial of Topamax  but she did not feel well on the Topamax  and stopped the medication.   Plan:  Motivational interviewing as well as evidence-based interventions for health behavior change were utilized today including the discussion of self monitoring techniques, problem-solving barriers and SMART goal setting techniques.  She  will begin a probiotic to help with her gut brain axis and  microbiome. Information sheet on supplements that have been vetted and suggested Garden of life probiotic for women. She will increase her water.  We will not resume Topamax . Discussed distractions to curb eating behaviors. Discussed activities to do with one's hands in the evening  Be sure to get adequate rest as lack of rest can trigger appetite.  Have plan in place for stressful events.  Consider other rewards besides food.    Prediabetes Last A1c was 6.2  Medication(s): none Lab Results  Component Value Date   HGBA1C 6.2 (H) 12/28/2022   Lab Results  Component Value Date   INSULIN  41.7 (H) 12/28/2022    Plan: She will increase her water intake to 64 ounces and or above. She may consider a GLP-1 (self pay)in the future Will minimize all refined carbohydrates both sweets and starches.  Will work on the plan and exercise.  Consider both aerobic and resistance training.  Will keep protein, water, and fiber intake high.  Increase Polyunsaturated and Monounsaturated fats to increase satiety  and encourage weight loss.  Aim for 7 to 9 hours of sleep nightly.  Handouts for carbohydrates and protein.    Generalized Obesity: Current BMI BMI (Calculated): 33.61   Pharmacotherapy Plan We may consider a GLP-1 in the future.  I gave her the informational sheet on GLP-1's for her to review  Jillian Murray is currently in the action stage of change. As such, her goal is to continue with weight loss efforts.  She has agreed to the Category 2 plan.  Exercise goals: Older adults should follow the adult guidelines. When older adults cannot meet the adult guidelines, they should be as physically active as their abilities and conditions will allow.   Behavioral modification strategies: increasing lean protein intake, no meal skipping, meal planning , increase water intake, better snacking choices, planning for success, increasing vegetables,  increasing fiber rich foods, keep healthy foods in the home, and increase frequency of journaling.  Jillian Murray has agreed to follow-up with our clinic in 4 weeks.      Objective:   VITALS: Per patient if applicable, see vitals. GENERAL: Alert and in no acute distress. CARDIOPULMONARY: No increased WOB. Speaking in clear sentences.  PSYCH: Pleasant and cooperative. Speech normal rate and rhythm. Affect is appropriate. Insight and judgement are appropriate. Attention is focused, linear, and appropriate.  NEURO: Oriented as arrived to appointment on time with no prompting.   Attestation Statements:   This was prepared with the assistance of Engineer, civil (consulting).  Occasional wrong-word or sound-a-like substitutions may have occurred due to the inherent limitations of voice recognition   Kirk Peper, DO

## 2023-10-12 ENCOUNTER — Encounter: Payer: Self-pay | Admitting: Bariatrics

## 2023-10-12 ENCOUNTER — Ambulatory Visit: Admitting: Bariatrics

## 2023-10-12 VITALS — BP 147/66 | HR 48 | Temp 97.7°F | Ht 68.0 in | Wt 222.0 lb

## 2023-10-12 DIAGNOSIS — R7303 Prediabetes: Secondary | ICD-10-CM

## 2023-10-12 DIAGNOSIS — Z6833 Body mass index (BMI) 33.0-33.9, adult: Secondary | ICD-10-CM | POA: Diagnosis not present

## 2023-10-12 DIAGNOSIS — E669 Obesity, unspecified: Secondary | ICD-10-CM

## 2023-10-12 DIAGNOSIS — E66811 Obesity, class 1: Secondary | ICD-10-CM

## 2023-10-12 NOTE — Progress Notes (Signed)
 WEIGHT SUMMARY AND BIOMETRICS  Weight Lost Since Last Visit: 0  Weight Gained Since Last Visit: 1lb   Vitals Temp: 97.7 F (36.5 C) BP: (!) 147/66 Pulse Rate: (!) 48 SpO2: 100 %   Anthropometric Measurements Height: 5' 8 (1.727 m) Weight: 222 lb (100.7 kg) BMI (Calculated): 33.76 Weight at Last Visit: 221lb Weight Lost Since Last Visit: 0 Weight Gained Since Last Visit: 1lb Starting Weight: 244lb Total Weight Loss (lbs): 22 lb (9.979 kg)   Body Composition  Body Fat %: 46.5 % Fat Mass (lbs): 103.4 lbs Muscle Mass (lbs): 112.8 lbs Total Body Water (lbs): 88.4 lbs Visceral Fat Rating : 14   Other Clinical Data Fasting: no Labs: no Today's Visit #: 15 Starting Date: 12/28/22    OBESITY Jillian Murray is here to discuss her progress with her obesity treatment plan along with follow-up of her obesity related diagnoses.    Nutrition Plan: the Category 2 plan - 50% adherence.  Current exercise: swimming and goes to the gym  Interim History:  She is up 1 lb since her last visit.  She states that she has been struggling some.  States that she has been and able to do salads but has tried to eat adequate protein.  She states that she did go back to the gym and has gotten in the pool. Not eating all of the food on the plan., Protein intake is as prescribed, Not journaling consistently., and Water intake is inadequate.  Hunger is moderately controlled.  Cravings are moderately controlled.  Assessment/Plan:   Prediabetes Last A1c was 6.2  Medication(s): none Lab Results  Component Value Date   HGBA1C 6.2 (H) 12/28/2022   Lab Results  Component Value Date   INSULIN  41.7 (H) 12/28/2022    Plan: Will minimize all refined carbohydrates both sweets and starches.  Will work on the plan and exercise.  She will continue to go to the gym on a regular basis with  an ultimate goal of 3 to 4 days/week. Will keep protein, water, and fiber intake high.  She states that she will drink 2 shakes per day and then will eat a normal high-protein meal.  I told her that that would be fine as long as she realizes that this is not a permanent solution and we will make modifications when she returns to the office.  In order to.lose electrolytes she will add a either a Gatorade 0 or Powerade 0 once per day.  She is going to increase her water and we discussed strategies that will enable her to do that. She will incorporate aspects of the anti-inflammatory diet.    Generalized Obesity: Current BMI BMI (Calculated): 33.76    Jillian Murray is currently in the action stage of change. As such, her goal is to continue with weight loss efforts.  She has agreed to the Category 2 plan with  aspects of the anti-inflammatory diet.  Exercise goals: Older adults should determine their level of effort for physical activity relative to their level of fitness.   Behavioral modification strategies: increasing lean protein intake, no meal skipping, meal planning , increase water intake, better snacking choices, planning for success, increasing vegetables, avoiding temptations, keep healthy foods in the home, keep a strict food journal, weigh protein portions, work on smaller portions, and mindful eating.  Scott has agreed to follow-up with our clinic in 3 weeks.      Objective:   VITALS: Per patient if applicable, see vitals. GENERAL: Alert and in no acute distress. CARDIOPULMONARY: No increased WOB. Speaking in clear sentences.  PSYCH: Pleasant and cooperative. Speech normal rate and rhythm. Affect is appropriate. Insight and judgement are appropriate. Attention is focused, linear, and appropriate.  NEURO: Oriented as arrived to appointment on time with no prompting.   Attestation Statements:   This was prepared with the assistance of Engineer, civil (consulting).  Occasional wrong-word or  sound-a-like substitutions may have occurred due to the inherent limitations of voice recognition   Clayborne Daring, DO

## 2023-11-02 ENCOUNTER — Ambulatory Visit: Admitting: Bariatrics

## 2023-11-08 ENCOUNTER — Ambulatory Visit: Admitting: Bariatrics

## 2023-11-22 ENCOUNTER — Ambulatory Visit: Admitting: Bariatrics

## 2023-11-24 ENCOUNTER — Encounter: Payer: Self-pay | Admitting: Bariatrics

## 2023-11-24 ENCOUNTER — Ambulatory Visit: Admitting: Bariatrics

## 2023-11-24 VITALS — BP 141/74 | HR 50 | Temp 97.8°F | Ht 68.0 in | Wt 222.0 lb

## 2023-11-24 DIAGNOSIS — E6609 Other obesity due to excess calories: Secondary | ICD-10-CM

## 2023-11-24 DIAGNOSIS — R632 Polyphagia: Secondary | ICD-10-CM | POA: Diagnosis not present

## 2023-11-24 DIAGNOSIS — Z6833 Body mass index (BMI) 33.0-33.9, adult: Secondary | ICD-10-CM | POA: Diagnosis not present

## 2023-11-24 DIAGNOSIS — E669 Obesity, unspecified: Secondary | ICD-10-CM

## 2023-11-24 DIAGNOSIS — N1831 Chronic kidney disease, stage 3a: Secondary | ICD-10-CM

## 2023-11-24 NOTE — Progress Notes (Signed)
 WEIGHT SUMMARY AND BIOMETRICS  Weight Lost Since Last Visit: 0  Weight Gained Since Last Visit: 0   Vitals Temp: 97.8 F (36.6 C) BP: (!) 141/74 Pulse Rate: (!) 50 SpO2: 99 %   Anthropometric Measurements Height: 5' 8 (1.727 m) Weight: 222 lb (100.7 kg) BMI (Calculated): 33.76 Weight at Last Visit: 222lb Weight Lost Since Last Visit: 0 Weight Gained Since Last Visit: 0 Starting Weight: 244lb Total Weight Loss (lbs): 22 lb (9.979 kg)   Body Composition  Body Fat %: 46.7 % Fat Mass (lbs): 103.6 lbs Muscle Mass (lbs): 112.4 lbs Total Body Water (lbs): 86.6 lbs Visceral Fat Rating : 14   Other Clinical Data Fasting: no Labs: no Today's Visit #: 16 Starting Date: 12/28/22    OBESITY Jillian is here to discuss her progress with her obesity treatment plan along with follow-up of her obesity related diagnoses.    Nutrition Plan: the Category 2 plan - 50% adherence.  Current exercise: Goes to the gym for exercise.  Interim History:  Her weight remains the same.  She states that she was in [place]  and that she gained 5 pounds but she has been able to get the weight back off since coming home.  Additionally we may consider some medication in the future however right now she is anticipating paying 500+ dollars for her monthly eye injections.  Eating all of the food on the plan., Protein intake is as prescribed, Not journaling consistently., Meeting protein goals., and Water intake is adequate.   Pharmacotherapy: Wyndi is not on any anti-obesity medications at this time.  Hunger is moderately controlled.  Cravings are moderately controlled.  Assessment/Plan:   Bellanie Matthew endorses excessive hunger.  Medication(s): none Effects of medication:  moderately controlled. Cravings are moderately controlled.   Plan: Medication(s): Will consider a GLP-1  in the future.  Will increase water, protein and fiber to help assuage hunger.  Will minimize foods that have a high glucose index/load to minimize reactive hypoglycemia.  She will continue to minimize her carbohydrates.    Generalized Obesity: Current BMI BMI (Calculated): 33.76   Pharmacotherapy Plan She is not currently on any antiobesity medications but may consider a GLP-1 for the future.  Zanasia is currently in the action stage of change. As such, her goal is to continue with weight loss efforts.  She has agreed to the Category 2 plan and keeping a food journal with goal of 1,200 calories and 80 grams of protein daily.  Exercise goals: Older adults should determine their level of effort for physical activity relative to their level of fitness.   Behavioral modification strategies: increasing lean protein intake, decreasing simple carbohydrates , no meal skipping, decrease eating out, meal planning , better snacking choices, planning for success, increasing vegetables, avoiding temptations, weigh protein portions, measure portion sizes, and mindful eating.  Caressa has agreed to follow-up  with our clinic in 2 weeks.     Objective:   VITALS: Per patient if applicable, see vitals. GENERAL: Alert and in no acute distress. CARDIOPULMONARY: No increased WOB. Speaking in clear sentences.  PSYCH: Pleasant and cooperative. Speech normal rate and rhythm. Affect is appropriate. Insight and judgement are appropriate. Attention is focused, linear, and appropriate.  NEURO: Oriented as arrived to appointment on time with no prompting.   Attestation Statements:   This was prepared with the assistance of Engineer, civil (consulting).  Occasional wrong-word or sound-a-like substitutions may have occurred due to the inherent limitations of voice recognition   Clayborne Daring, DO

## 2023-12-07 ENCOUNTER — Ambulatory Visit: Admitting: Bariatrics

## 2023-12-13 ENCOUNTER — Encounter: Payer: Self-pay | Admitting: Bariatrics

## 2023-12-13 ENCOUNTER — Ambulatory Visit: Admitting: Bariatrics

## 2023-12-13 VITALS — BP 148/73 | HR 58 | Temp 97.7°F | Ht 68.0 in | Wt 221.0 lb

## 2023-12-13 DIAGNOSIS — E669 Obesity, unspecified: Secondary | ICD-10-CM | POA: Diagnosis not present

## 2023-12-13 DIAGNOSIS — E66811 Obesity, class 1: Secondary | ICD-10-CM

## 2023-12-13 DIAGNOSIS — Z6833 Body mass index (BMI) 33.0-33.9, adult: Secondary | ICD-10-CM

## 2023-12-13 DIAGNOSIS — F5089 Other specified eating disorder: Secondary | ICD-10-CM | POA: Diagnosis not present

## 2023-12-13 NOTE — Progress Notes (Signed)
 WEIGHT SUMMARY AND BIOMETRICS  Weight Lost Since Last Visit: 1lb  Weight Gained Since Last Visit: 0   Vitals Temp: 97.7 F (36.5 C) BP: (!) 148/73 Pulse Rate: (!) 58 SpO2: 100 %   Anthropometric Measurements Height: 5' 8 (1.727 m) Weight: 221 lb (100.2 kg) BMI (Calculated): 33.61 Weight at Last Visit: 222lb Weight Lost Since Last Visit: 1lb Weight Gained Since Last Visit: 0 Starting Weight: 244lb Total Weight Loss (lbs): 23 lb (10.4 kg)   Body Composition  Body Fat %: 46.5 % Fat Mass (lbs): 103 lbs Muscle Mass (lbs): 112.6 lbs Total Body Water (lbs): 86.8 lbs Visceral Fat Rating : 14   Other Clinical Data Fasting: no Labs: no Today's Visit #: 17 Starting Date: 12/28/22    OBESITY Ondrea is here to discuss her progress with her obesity treatment plan along with follow-up of her obesity related diagnoses.    Nutrition Plan: the Category 2 plan - 50% adherence.  Current exercise: Goes to the gym for exercise  Interim History:  She is down 1 lb since her last visit. She is going to [employer] and doing some water exercise. She is sleeping better and not eating at night.  She is wearing smaller clothes.  Not eating all of the food on the plan., Protein intake is as prescribed, Not journaling consistently., and Water intake is adequate.   Pharmacotherapy: Natelie is not on any anti-obesity medications.  We have discussed the GLP-1's and she may be amenable to a GLP-1 in the future. Hunger is moderately controlled.  Cravings are moderately controlled.  Assessment/Plan:   Eating disorder/emotional eating Lorah has had issues with stress eating, emotional eating, and boredom eating. Currently this is moderately controlled. Overall mood is stable. Denies suicidal/homicidal ideation. Medication(s): none  Plan:  Motivational interviewing as well as  evidence-based interventions for health behavior change were utilized today including the discussion of self monitoring techniques, problem-solving barriers and SMART goal setting techniques.  Discussed distractions to curb eating behaviors. Discussed activities to do with one's hands in the evening  Be sure to get adequate rest as lack of rest can trigger appetite.  Have plan in place for stressful events.  Consider other rewards besides food.      Generalized Obesity: Current BMI BMI (Calculated): 33.61   Amila is currently in the action stage of change. As such, her goal is to continue with weight loss efforts.  She has agreed to the Category 2 plan.  Exercise goals: Older adults should determine their level of effort for physical activity relative to their level of fitness.  She is planning to do water aerobics approximately 4 days a week for over an hour each session.  Behavioral modification strategies: increasing lean protein intake, decreasing simple carbohydrates , no meal skipping, decrease eating out, meal planning , increase water intake, better snacking choices, planning for success,  increasing vegetables, get rid of junk food in the home, decrease snacking , keep healthy foods in the home, weigh protein portions, measure portion sizes, and mindful eating.  Quantavia has agreed to follow-up with our clinic in 2 weeks.       Objective:   VITALS: Per patient if applicable, see vitals. GENERAL: Alert and in no acute distress. CARDIOPULMONARY: No increased WOB. Speaking in clear sentences.  PSYCH: Pleasant and cooperative. Speech normal rate and rhythm. Affect is appropriate. Insight and judgement are appropriate. Attention is focused, linear, and appropriate.  NEURO: Oriented as arrived to appointment on time with no prompting.   Attestation Statements:   This was prepared with the assistance of Engineer, civil (consulting).  Occasional wrong-word or sound-a-like substitutions may  have occurred due to the inherent limitations of voice recognition   Clayborne Daring, DO

## 2023-12-27 ENCOUNTER — Ambulatory Visit: Admitting: Nurse Practitioner

## 2023-12-27 ENCOUNTER — Encounter: Payer: Self-pay | Admitting: Nurse Practitioner

## 2023-12-27 VITALS — BP 132/68 | HR 66 | Temp 98.0°F | Ht 68.0 in | Wt 219.0 lb

## 2023-12-27 DIAGNOSIS — R632 Polyphagia: Secondary | ICD-10-CM

## 2023-12-27 DIAGNOSIS — E66811 Obesity, class 1: Secondary | ICD-10-CM | POA: Diagnosis not present

## 2023-12-27 DIAGNOSIS — Z6833 Body mass index (BMI) 33.0-33.9, adult: Secondary | ICD-10-CM | POA: Diagnosis not present

## 2023-12-27 NOTE — Progress Notes (Signed)
 Office: [phone]  /  Fax: [phone]  WEIGHT SUMMARY AND BIOMETRICS  Weight Lost Since Last Visit: 2lb  Weight Gained Since Last Visit: 0lb   Vitals Temp: 98 F (36.7 C) BP: 132/68 Pulse Rate: 66 SpO2: 100 %   Anthropometric Measurements Height: 5' 8 (1.727 m) Weight: 219 lb (99.3 kg) BMI (Calculated): 33.31 Weight at Last Visit: 221lb Weight Lost Since Last Visit: 2lb Weight Gained Since Last Visit: 0lb Starting Weight: 244lb Total Weight Loss (lbs): 25 lb (11.3 kg)   Body Composition  Body Fat %: 46.2 % Fat Mass (lbs): 101.4 lbs Muscle Mass (lbs): 112 lbs Total Body Water (lbs): 88.6 lbs Visceral Fat Rating : 14   Other Clinical Data Fasting: No Labs: No Today's Visit #: 18 Starting Date: 12/28/22     HPI  Chief Complaint: OBESITY  Jillian Murray is here to discuss her progress with her obesity treatment plan. She is on the the Category 2 Plan and states she is following her eating plan approximately 70-75 % of the time. She states she is exercising 60-90 minutes 2-3 days per week.   Interval History:  Since last office visit she has lost 2 pounds. She eats out a couple days week. She notes she does tend to continue eating when she feels full and always hungry. She is aiming to try to eat more protein.  She is more aware of her portion sizes.   She will skip lunch or dinner.   She notes she has been stuck at 220 lbs and decided to start going to the gym more since her last visit.   She continues to work on finding a positive reinforcement to help her continue working on her weight loss journey.  She is drinking little water, hot tea 5-6 cups and diet sodas.    Her highest weight was 250 lbs.     Pharmacotherapy for weight loss: She is not currently taking medications  for medical weight loss.   She tried topamax  in the past and stopped due to side effects.   Bariatric surgery:  Patient has not had bariatric surgery   PHYSICAL EXAM:  Blood  pressure 132/68, pulse 66, temperature 98 F (36.7 C), height 5' 8 (1.727 m), weight 219 lb (99.3 kg), SpO2 100%. Body mass index is 33.3 kg/m.  General: She is overweight, cooperative, alert, well developed, and in no acute distress. PSYCH: Has normal mood, affect and thought process.   Extremities: No edema.  Neurologic: No gross sensory or motor deficits. No tremors or fasciculations noted.    DIAGNOSTIC DATA REVIEWED:  BMET    Component Value Date/Time   NA 139 12/28/2022 1007   K 4.7 12/28/2022 1007   CL 105 12/28/2022 1007   CO2 22 12/28/2022 1007   GLUCOSE 115 (H) 12/28/2022 1007   BUN 21 12/28/2022 1007   CREATININE 1.21 (H) 12/28/2022 1007   CALCIUM 9.4 12/28/2022 1007   Lab Results  Component Value Date   HGBA1C 6.2 (H) 12/28/2022   Lab Results  Component Value Date   INSULIN  41.7 (H) 12/28/2022   Lab Results  Component Value Date   TSH 3.630 12/28/2022   CBC    Component Value Date/Time   WBC 6.1 12/28/2022 1007   RBC 4.44 12/28/2022 1007   HGB 12.8 12/28/2022 1007   HCT 39.8 12/28/2022 1007   PLT 238 12/28/2022 1007   MCV 90 12/28/2022 1007   MCH 28.8 12/28/2022 1007   MCHC 32.2 12/28/2022 1007  RDW 13.1 12/28/2022 1007   Iron Studies No results found for: IRON, TIBC, FERRITIN, IRONPCTSAT Lipid Panel     Component Value Date/Time   CHOL 188 12/28/2022 1007   TRIG 132 12/28/2022 1007   HDL 48 12/28/2022 1007   LDLCALC 116 (H) 12/28/2022 1007   Hepatic Function Panel     Component Value Date/Time   PROT 6.8 12/28/2022 1007   ALBUMIN 4.2 12/28/2022 1007   AST 21 12/28/2022 1007   ALT 27 12/28/2022 1007   ALKPHOS 97 12/28/2022 1007   BILITOT 0.4 12/28/2022 1007      Component Value Date/Time   TSH 3.630 12/28/2022 1007   Nutritional Lab Results  Component Value Date   VD25OH 42.4 12/28/2022     ASSESSMENT AND PLAN  TREATMENT PLAN FOR OBESITY:  Recommended Dietary Goals  Jillian Murray is currently in the action stage of  change. As such, her goal is to continue weight management plan. I'm encouraged her to track and we will review her macros at her next visit.  Behavioral Intervention  We discussed the following Behavioral Modification Strategies today: increasing lean protein intake to established goals, decreasing simple carbohydrates , increasing vegetables, increasing fiber rich foods, avoiding skipping meals, increasing water intake , work on meal planning and preparation, work on tracking and journaling calories using tracking application, continue to work on maintaining a reduced calorie state, getting the recommended amount of protein, incorporating whole foods, making healthy choices, staying well hydrated and practicing mindfulness when eating., and increase protein intake, fibrous foods (25 grams per day for women, 30 grams for men) and water to improve satiety and decrease hunger signals. .  Additional resources provided today: NA  Recommended Physical Activity Goals  Jillian Murray has been advised to work up to 150 minutes of moderate intensity aerobic activity a week and strengthening exercises 2-3 times per week for cardiovascular health, weight loss maintenance and preservation of muscle mass.   She has agreed to Think about enjoyable ways to increase daily physical activity and overcoming barriers to exercise, Increase physical activity in their day and reduce sedentary time (increase NEAT)., Increase volume of physical activity to a goal of 240 minutes a week, and Combine aerobic and strengthening exercises for efficiency and improved cardiometabolic health.    ASSOCIATED CONDITIONS ADDRESSED TODAY  Action/Plan  Polyphagia Intensive lifestyle modifications are the first line treatment for this issue. We discussed several lifestyle modifications today and she will continue to work on diet, exercise and weight loss efforts. Orders and follow up as documented in patient record.  Counseling Polyphagia  is excessive hunger. Causes can include: low blood sugars, hypERthyroidism, PMS, lack of sleep, stress, insulin  resistance, diabetes, certain medications, and diets that are deficient in protein and fiber.    Obesity, Class I, BMI 30-34.9  She has overall done well with weight loss and I did reinforce this with her.  She has made some positive changes since her last visit and is looking forward to continuing going to the gym.  She is also using positive outlets such as social media to continue to help her with positive thinking while she is on her weight loss journey.     Return in about 2 weeks (around 01/10/2024).SABRA She was informed of the importance of frequent follow up visits to maximize her success with intensive lifestyle modifications for her multiple health conditions.  I personally spent a total of 30+ minutes in the care of the patient today including preparing to see the patient,  getting/reviewing separately obtained history, performing a medically appropriate exam/evaluation, counseling and educating, and documenting clinical information in the EHR.   ATTESTASTION STATEMENTS:  Reviewed by clinician on day of visit: allergies, medications, problem list, medical history, surgical history, family history, social history, and previous encounter notes.     Corean SAUNDERS. Nadav Swindell FNP-C

## 2024-01-12 ENCOUNTER — Ambulatory Visit: Admitting: Bariatrics

## 2024-01-12 ENCOUNTER — Encounter: Payer: Self-pay | Admitting: Bariatrics

## 2024-01-12 VITALS — BP 121/57 | HR 53 | Temp 97.7°F | Ht 68.0 in | Wt 219.0 lb

## 2024-01-12 DIAGNOSIS — E669 Obesity, unspecified: Secondary | ICD-10-CM | POA: Diagnosis not present

## 2024-01-12 DIAGNOSIS — Z6833 Body mass index (BMI) 33.0-33.9, adult: Secondary | ICD-10-CM

## 2024-01-12 DIAGNOSIS — E6609 Other obesity due to excess calories: Secondary | ICD-10-CM

## 2024-01-12 DIAGNOSIS — R632 Polyphagia: Secondary | ICD-10-CM | POA: Diagnosis not present

## 2024-01-12 NOTE — Progress Notes (Signed)
 WEIGHT SUMMARY AND BIOMETRICS  Weight Lost Since Last Visit: 0  Weight Gained Since Last Visit: 0   Vitals Temp: 97.7 F (36.5 C) BP: (!) 121/57 Pulse Rate: (!) 53 SpO2: 98 %   Anthropometric Measurements Height: 5' 8 (1.727 m) Weight: 219 lb (99.3 kg) BMI (Calculated): 33.31 Weight at Last Visit: 219lb Weight Lost Since Last Visit: 0 Weight Gained Since Last Visit: 0 Starting Weight: 244lb Total Weight Loss (lbs): 25 lb (11.3 kg)   Body Composition  Body Fat %: 45.3 % Fat Mass (lbs): 99.2 lbs Muscle Mass (lbs): 113.8 lbs Total Body Water (lbs): 83.4 lbs Visceral Fat Rating : 14   Other Clinical Data Fasting: no Labs: no Today's Visit #: 19 Starting Date: 12/28/22    OBESITY Reegan is here to discuss her progress with her obesity treatment plan along with follow-up of her obesity related diagnoses.    Nutrition Plan: the Category 2 plan - 50% adherence.  Current exercise: Goes to the gym.  Interim History:  Her weight remains the same. She is below 220 lbs. She says that she is making better choices. She states that her mindset is changed.  She is going to the gym.  Eating all of the food on the plan., Protein intake is as prescribed, Is not skipping meals, Not journaling consistently., and Water intake is adequate.   Hunger is moderately controlled.  Cravings are moderately controlled.  Assessment/Plan:   Georgeanne Frankland endorses excessive hunger.  Medication(s): none Effects of medication:  moderately controlled. Cravings are moderately controlled.   Plan: Medication(s): none Will increase water, protein and fiber to help assuage hunger.  Will minimize foods that have a high glucose index/load to minimize reactive hypoglycemia.  Will focus on healthy habits. Will continue to make good decisions in regard to food.     Generalized  Obesity: Current BMI BMI (Calculated): 33.31   Pharmacotherapy Plan No antiobesity medications at this time but may consider one in the future.  Mackinley is currently in the action stage of change. As such, her goal is to continue with weight loss efforts.  She has agreed to the Category 2 plan.  Exercise goals: All adults should avoid inactivity. Some physical activity is better than none, and adults who participate in any amount of physical activity gain some health benefits. She will continue going to the gym.   Behavioral modification strategies: increasing lean protein intake, no meal skipping, meal planning , better snacking choices, planning for success, and mindful eating.  Blaire has agreed to follow-up with our clinic in 2 weeks.      Objective:   VITALS: Per patient if applicable, see vitals. GENERAL: Alert and in no acute distress. CARDIOPULMONARY: No increased WOB. Speaking in clear sentences.  PSYCH: Pleasant and cooperative. Speech normal rate and rhythm. Affect is appropriate. Insight and judgement are appropriate. Attention is  focused, linear, and appropriate.  NEURO: Oriented as arrived to appointment on time with no prompting.   Attestation Statements:    Time spent on visit including pre-visit chart review and post-visit charting and care was 34 minutes.    We discussed the following: Anti-inflammatory foods, A1c, better habits in regard to meal choices.  This was prepared with the assistance of Engineer, civil (consulting).  Occasional wrong-word or sound-a-like substitutions may have occurred due to the inherent limitations of voice recognition   Clayborne Daring, DO

## 2024-01-26 ENCOUNTER — Encounter: Payer: Self-pay | Admitting: Bariatrics

## 2024-01-26 ENCOUNTER — Ambulatory Visit: Admitting: Bariatrics

## 2024-01-26 VITALS — BP 124/73 | HR 50 | Temp 97.5°F | Ht 68.0 in | Wt 216.0 lb

## 2024-01-26 DIAGNOSIS — E669 Obesity, unspecified: Secondary | ICD-10-CM | POA: Diagnosis not present

## 2024-01-26 DIAGNOSIS — R632 Polyphagia: Secondary | ICD-10-CM

## 2024-01-26 DIAGNOSIS — Z6832 Body mass index (BMI) 32.0-32.9, adult: Secondary | ICD-10-CM | POA: Diagnosis not present

## 2024-01-26 DIAGNOSIS — E66811 Obesity, class 1: Secondary | ICD-10-CM

## 2024-01-26 NOTE — Progress Notes (Signed)
 WEIGHT SUMMARY AND BIOMETRICS  Weight Lost Since Last Visit: 3lb  Weight Gained Since Last Visit: 0   Vitals Temp: (!) 97.5 F (36.4 C) BP: 124/73 Pulse Rate: (!) 50 SpO2: 98 %   Anthropometric Measurements Height: 5' 8 (1.727 m) Weight: 216 lb (98 kg) BMI (Calculated): 32.85 Weight at Last Visit: 219lb Weight Lost Since Last Visit: 3lb Weight Gained Since Last Visit: 0 Starting Weight: 244lb Total Weight Loss (lbs): 28 lb (12.7 kg)   Body Composition  Body Fat %: 44.3 % Fat Mass (lbs): 95.8 lbs Muscle Mass (lbs): 114.4 lbs Total Body Water (lbs): 82.6 lbs Visceral Fat Rating : 13   Other Clinical Data Fasting: no Labs: no Today's Visit #: 20 Starting Date: 12/28/22    OBESITY Jillian Murray is here to discuss her progress with her obesity treatment plan along with follow-up of her obesity related diagnoses.    Nutrition Plan: the Category 2 plan - 60% adherence.  Current exercise: swimming  Interim History:  She is down another 3 lbs since her last visit. She can eat sensibly most of the time.  She is down 3 pounds and down 4 pounds from her previous set point of 220 pounds.  She is encouraged with the fact that she is down below to 220 pounds. Protein intake is as prescribed, Is not skipping meals, and Water intake is adequate.   Pharmacotherapy: Jillian Murray is not on any anti-obesity medications.  Hunger is moderately controlled.  Cravings are moderately controlled.  Assessment/Plan:   Jillian Murray endorses excessive hunger, but it is now improving.  Medication(s): none Appetite:  moderately controlled. Cravings are moderately controlled.   Plan: Medication(s): none Will increase water, protein and fiber to help assuage hunger.  Will minimize foods that have a high glucose index/load to minimize reactive hypoglycemia.  Discussed ways to  navigate her upcoming vacation. She will have a protein bar prior to her exercise. She will continue her pool walking with some weights on a regular basis ideally 4 times a week.     Generalized Obesity: Current BMI BMI (Calculated): 32.85   Pharmacotherapy Plan She is not on any antiobesity medications at this time.  Jillian Murray is currently in the action stage of change. As such, her goal is to continue with weight loss efforts.  She has agreed to the Category 2 plan.  Additionally, she will keep her protein high along with her water.  Exercise goals: Older adults should determine their level of effort for physical activity relative to their level of fitness.   Behavioral modification strategies: increasing lean protein intake, decreasing simple carbohydrates , no meal skipping, decrease eating out, meal planning , increase water intake, better snacking choices, planning for success, increasing vegetables, decrease snacking , measure portion sizes, and work on smaller portions.  Jillian Murray has agreed to follow-up with our clinic in 4 weeks.  Objective:   VITALS: Per patient if applicable, see vitals. GENERAL: Alert and in no acute distress. CARDIOPULMONARY: No increased WOB. Speaking in clear sentences.  PSYCH: Pleasant and cooperative. Speech normal rate and rhythm. Affect is appropriate. Insight and judgement are appropriate. Attention is focused, linear, and appropriate.  NEURO: Oriented as arrived to appointment on time with no prompting.   Attestation Statements:   This was prepared with the assistance of Engineer, civil (consulting).  Occasional wrong-word or sound-a-like substitutions may have occurred due to the inherent limitations of voice recognition   Jillian Daring, DO

## 2024-02-28 ENCOUNTER — Ambulatory Visit: Admitting: Bariatrics

## 2024-02-28 ENCOUNTER — Encounter: Payer: Self-pay | Admitting: Bariatrics

## 2024-02-28 VITALS — BP 143/77 | HR 54 | Ht 68.0 in | Wt 221.0 lb

## 2024-02-28 DIAGNOSIS — E669 Obesity, unspecified: Secondary | ICD-10-CM

## 2024-02-28 DIAGNOSIS — Z6833 Body mass index (BMI) 33.0-33.9, adult: Secondary | ICD-10-CM | POA: Diagnosis not present

## 2024-02-28 DIAGNOSIS — R632 Polyphagia: Secondary | ICD-10-CM | POA: Diagnosis not present

## 2024-02-28 NOTE — Progress Notes (Signed)
 WEIGHT SUMMARY AND BIOMETRICS  Weight Lost Since Last Visit: 0  Weight Gained Since Last Visit: 5lb   Vitals BP: (!) 143/77 Pulse Rate: (!) 54 SpO2: 99 %   Anthropometric Measurements Height: 5' 8 (1.727 m) Weight: 221 lb (100.2 kg) BMI (Calculated): 33.61 Weight at Last Visit: 216lb Weight Lost Since Last Visit: 0 Weight Gained Since Last Visit: 5lb Starting Weight: 244lb Total Weight Loss (lbs): 23 lb (10.4 kg)   Body Composition  Body Fat %: 46 % Fat Mass (lbs): 101.8 lbs Muscle Mass (lbs): 113.4 lbs Total Body Water (lbs): 83.5 lbs Visceral Fat Rating : 14   Other Clinical Data Fasting: no Labs: no Today's Visit #: 21 Starting Date: 12/28/22    OBESITY Jillian Murray is here to discuss her progress with her obesity treatment plan along with follow-up of her obesity related diagnoses.    Nutrition Plan: the Category 2 plan - 20% adherence.  Current exercise: swimming and walking  Interim History:  She has been on vacation for 3 weeks and gained 5 lbs during that time.  Not eating all of the food on the plan., Protein intake is as prescribed, Is not skipping meals, and Water intake is adequate.   Pharmacotherapy: Jillian Murray is not on any anti-obesity medications.  Hunger is moderately controlled.  Cravings are moderately controlled.  Assessment/Plan:   Polyphagia Jillian Murray endorses excessive hunger occasionally.  Medication(s): none. She declines anti-obesity medications at this time.  Appetite:  moderately controlled. Cravings are moderately controlled.   Plan: Medication(s): none Will increase water, protein and fiber to help assuage hunger.  Will minimize foods that have a high glucose index/load to minimize reactive hypoglycemia.  Be sure to get adequate rest as lack of rest can trigger appetite.  Have plan in place for stressful events.     Generalized Obesity: Current BMI BMI (Calculated): 33.61   Pharmacotherapy Plan No medications.  Jillian Murray is currently in the action stage of change. As such, her goal is to continue with weight loss efforts.  She has agreed to the Category 2 plan.  Exercise goals: All adults should avoid inactivity. Some physical activity is better than none, and adults who participate in any amount of physical activity gain some health benefits.  Behavioral modification strategies: increasing lean protein intake, no meal skipping, meal planning , increasing fiber rich foods, avoiding temptations, weigh protein portions, measure portion sizes, and mindful eating.  Jillian Murray has agreed to follow-up with our clinic in 4 weeks.     Objective:   VITALS: Per patient if applicable, see vitals. GENERAL: Alert and in no acute distress. CARDIOPULMONARY: No increased WOB. Speaking in clear sentences.  PSYCH: Pleasant and cooperative. Speech normal rate and rhythm. Affect is appropriate. Insight and judgement are appropriate. Attention is focused, linear, and appropriate.  NEURO: Oriented as arrived to appointment on time with no prompting.  Attestation Statements:   This was prepared with the assistance of Engineer, Civil (consulting).  Occasional wrong-word or sound-a-like substitutions may have occurred due to the inherent limitations of voice recognition   Clayborne Daring, DO

## 2024-03-20 ENCOUNTER — Ambulatory Visit: Admitting: Bariatrics

## 2024-04-16 ENCOUNTER — Ambulatory Visit: Admitting: Bariatrics

## 2024-04-16 ENCOUNTER — Encounter: Payer: Self-pay | Admitting: Bariatrics

## 2024-04-16 VITALS — BP 155/78 | HR 69 | Ht 68.0 in | Wt 224.0 lb

## 2024-04-16 DIAGNOSIS — R632 Polyphagia: Secondary | ICD-10-CM | POA: Diagnosis not present

## 2024-04-16 DIAGNOSIS — E669 Obesity, unspecified: Secondary | ICD-10-CM

## 2024-04-16 DIAGNOSIS — Z6834 Body mass index (BMI) 34.0-34.9, adult: Secondary | ICD-10-CM | POA: Diagnosis not present

## 2024-04-16 DIAGNOSIS — E66811 Obesity, class 1: Secondary | ICD-10-CM

## 2024-04-16 NOTE — Progress Notes (Signed)
 "                                                                                                             WEIGHT SUMMARY AND BIOMETRICS  Weight Lost Since Last Visit: 0  Weight Gained Since Last Visit: 3lb   Vitals BP: (!) 155/78 Pulse Rate: 69 SpO2: 99 %   Anthropometric Measurements Height: 5' 8 (1.727 m) Weight: 224 lb (101.6 kg) BMI (Calculated): 34.07 Weight at Last Visit: 221lb Weight Lost Since Last Visit: 0 Weight Gained Since Last Visit: 3lb Starting Weight: 244lb Total Weight Loss (lbs): 26 lb (11.8 kg)   Body Composition  Body Fat %: 46.4 % Fat Mass (lbs): 104.2 lbs Muscle Mass (lbs): 114.2 lbs Total Body Water (lbs): 84.8 lbs Visceral Fat Rating : 15   Other Clinical Data Fasting: no Labs: no Today's Visit #: 22 Starting Date: 12/28/22    OBESITY Nevia is here to discuss her progress with her obesity treatment plan along with follow-up of her obesity related diagnoses.    Nutrition Plan: the Category 2 plan - 0% adherence.  Current exercise: none  Interim History:  She is up 3 lbs since the last visit. She took the month of December off. She did not go to the gym.  Eating all of the food on the plan., Protein intake is as prescribed, Is exceeding snack calorie allotment, and Water intake is adequate.   Pharmacotherapy: Merla is not on any anti-obesity medications Hunger is moderately controlled.  Cravings are moderately controlled.  Assessment/Plan:   Altha Sweitzer endorses excessive hunger.  Medication(s): none Appetite:  poorly controlled. Cravings are moderately controlled.   Plan: Medication(s): none, but may consider the oral GLP-1 in the future.  Will discuss at her next visit. Will increase water, protein and fiber to help assuage hunger.  Will minimize foods that have a high glucose index/load to minimize reactive hypoglycemia.    Generalized Obesity: Current BMI BMI (Calculated): 34.07   Pharmacotherapy Plan No  anti-obesity medications.   Laconda is currently in the action stage of change. As such, her goal is to continue with weight loss efforts.  She has agreed to the Category 2 plan.  Exercise goals: Older adults should determine their level of effort for physical activity relative to their level of fitness.  We discussed a variety of exercise options.  She will swim at the pool when she is in [place]  will continue walking and doing some weights. Behavioral modification strategies: increasing lean protein intake, no meal skipping, meal planning , increase water intake, better snacking choices, planning for success, increasing vegetables, increasing fiber rich foods, avoiding temptations, keep healthy foods in the home, weigh protein portions, measure portion sizes, and mindful eating.  Sherelle has agreed to follow-up with our clinic in 4 weeks.     Objective:   VITALS: Per patient if applicable, see vitals. GENERAL: Alert and in no acute distress. CARDIOPULMONARY: No increased WOB. Speaking in clear sentences.  PSYCH: Pleasant and cooperative. Speech normal rate and rhythm. Affect is  appropriate. Insight and judgement are appropriate. Attention is focused, linear, and appropriate.  NEURO: Oriented as arrived to appointment on time with no prompting.   Attestation Statements:   This was prepared with the assistance of Engineer, Civil (consulting).  Occasional wrong-word or sound-a-like substitutions may have occurred due to the inherent limitations of voice recognition.   Clayborne Daring, DO   "

## 2024-05-03 ENCOUNTER — Ambulatory Visit: Admitting: Bariatrics

## 2024-05-03 ENCOUNTER — Encounter: Payer: Self-pay | Admitting: Bariatrics

## 2024-05-03 VITALS — BP 152/63 | HR 53 | Ht 68.0 in | Wt 227.0 lb

## 2024-05-03 DIAGNOSIS — Z6834 Body mass index (BMI) 34.0-34.9, adult: Secondary | ICD-10-CM

## 2024-05-03 DIAGNOSIS — R632 Polyphagia: Secondary | ICD-10-CM

## 2024-05-03 DIAGNOSIS — E669 Obesity, unspecified: Secondary | ICD-10-CM | POA: Diagnosis not present

## 2024-05-03 DIAGNOSIS — E6609 Other obesity due to excess calories: Secondary | ICD-10-CM

## 2024-05-03 NOTE — Progress Notes (Signed)
 "                                                                                                             WEIGHT SUMMARY AND BIOMETRICS  Weight Lost Since Last Visit: 0  Weight Gained Since Last Visit: 3lb   Vitals BP: (!) 152/63 Pulse Rate: (!) 53 SpO2: 99 %   Anthropometric Measurements Height: 5' 8 (1.727 m) Weight: 227 lb (103 kg) BMI (Calculated): 34.52 Weight at Last Visit: 224lb Weight Lost Since Last Visit: 0 Weight Gained Since Last Visit: 3lb Starting Weight: 244lb Total Weight Loss (lbs): 23 lb (10.4 kg)   Body Composition  Body Fat %: 46 % Fat Mass (lbs): 104.6 lbs Muscle Mass (lbs): 116.6 lbs Total Body Water (lbs): 83.6 lbs Visceral Fat Rating : 15   Other Clinical Data Fasting: no Labs: no Today's Visit #: 23 Starting Date: 12/28/22    OBESITY Jillian Murray is here to discuss her progress with her obesity treatment plan along with follow-up of her obesity related diagnoses.    Nutrition Plan: the Category 2 plan - 0% adherence.  Current exercise: Goes to the gym  Interim History:  She is up 3 lb since her last visit.  Eating all of the food on the plan., Protein intake is as prescribed, Not journaling consistently., and Water intake is adequate.   Pharmacotherapy: Jillian Murray is not on any anti-obesity medications.  Hunger is moderately controlled. She states that her binging behaviors are better controlled.  Cravings are moderately controlled.  Assessment/Plan:    Jillian Murray endorses excessive hunger.  Medication(s): no anti-obesity medications.  Effects of medication:  moderately controlled. Cravings are moderately controlled.   Plan: Medication(s): No antiobesity medications at this time.  We did revisit her taking a GLP-1, but she wants to wait to discuss at her next visit.  Will increase water, protein and fiber to help assuage hunger.  Will minimize foods that have a high glucose index/load to minimize reactive hypoglycemia.   She will continue to workout at the gym doing her water walking. She will continue to make better choices with her food selection and with her portion size. She will start measuring her food and protein on a regular basis. She will have a protein shake daily to ensure that she gets adequate protein.    Generalized Obesity: Current BMI BMI (Calculated): 34.52   Pharmacotherapy Plan No medications at this time. She may consider a GLP-1 in the future.   Jillian Murray is currently in the action stage of change. As such, her goal is to continue with weight loss efforts.  She has agreed to the Category 2 plan.  Exercise goals: Older adults should determine their level of effort for physical activity relative to their level of fitness.  She will continue her swimming and other gym activities. Behavioral modification strategies: increasing lean protein intake, decreasing simple carbohydrates , no meal skipping, decrease eating out, meal planning , increase water intake, better snacking choices, planning for success, increasing vegetables, increasing fiber rich foods, and decrease  junk food.  Jillian Murray has agreed to follow-up with our clinic in 2 weeks.       Objective:   VITALS: Per patient if applicable, see vitals. GENERAL: Alert and in no acute distress. CARDIOPULMONARY: No increased WOB. Speaking in clear sentences.  PSYCH: Pleasant and cooperative. Speech normal rate and rhythm. Affect is appropriate. Insight and judgement are appropriate. Attention is focused, linear, and appropriate.  NEURO: Oriented as arrived to appointment on time with no prompting.   Attestation Statements:   This was prepared with the assistance of Engineer, Civil (consulting).  Occasional wrong-word or sound-a-like substitutions may have occurred due to the inherent limitations of voice recognition.   Clayborne Daring, DO    "

## 2024-05-17 ENCOUNTER — Ambulatory Visit: Admitting: Bariatrics

## 2024-06-07 ENCOUNTER — Ambulatory Visit: Admitting: Bariatrics

## 2024-06-11 ENCOUNTER — Ambulatory Visit: Admitting: Bariatrics
